# Patient Record
Sex: Male | Born: 1960 | Race: White | Hispanic: No | Marital: Married | Smoking: Never smoker
Health system: Southern US, Community
[De-identification: ages and names within clinical notes are randomized; demographics above are authoritative.]

## PROBLEM LIST (undated history)

## (undated) DIAGNOSIS — I872 Venous insufficiency (chronic) (peripheral): Secondary | ICD-10-CM

## (undated) DIAGNOSIS — I10 Essential (primary) hypertension: Secondary | ICD-10-CM

## (undated) DIAGNOSIS — E785 Hyperlipidemia, unspecified: Secondary | ICD-10-CM

## (undated) HISTORY — DX: Venous insufficiency (chronic) (peripheral): I87.2

## (undated) HISTORY — DX: Essential (primary) hypertension: I10

## (undated) HISTORY — DX: Hyperlipidemia, unspecified: E78.5

## (undated) HISTORY — PX: NO PAST SURGERIES: SHX2092

---

## 2011-05-30 ENCOUNTER — Ambulatory Visit (INDEPENDENT_AMBULATORY_CARE_PROVIDER_SITE_OTHER): Payer: BC Managed Care – PPO | Admitting: Internal Medicine

## 2011-05-30 ENCOUNTER — Encounter: Payer: Self-pay | Admitting: Internal Medicine

## 2011-05-30 DIAGNOSIS — E669 Obesity, unspecified: Secondary | ICD-10-CM

## 2011-05-30 DIAGNOSIS — E785 Hyperlipidemia, unspecified: Secondary | ICD-10-CM | POA: Insufficient documentation

## 2011-05-30 DIAGNOSIS — I10 Essential (primary) hypertension: Secondary | ICD-10-CM

## 2011-05-30 DIAGNOSIS — Z23 Encounter for immunization: Secondary | ICD-10-CM

## 2011-05-30 LAB — COMPREHENSIVE METABOLIC PANEL
ALT: 41 U/L (ref 0–53)
AST: 25 U/L (ref 0–37)
Albumin: 4.1 g/dL (ref 3.5–5.2)
Alkaline Phosphatase: 74 U/L (ref 39–117)
BUN: 15 mg/dL (ref 6–23)
CO2: 31 mEq/L (ref 19–32)
Calcium: 9.2 mg/dL (ref 8.4–10.5)
Chloride: 104 mEq/L (ref 96–112)
Creatinine, Ser: 1 mg/dL (ref 0.4–1.5)
GFR: 88.37 mL/min (ref >60.00)
Glucose, Bld: 80 mg/dL (ref 70–99)
Potassium: 3.9 mEq/L (ref 3.5–5.1)
Sodium: 139 mEq/L (ref 135–145)
Total Bilirubin: 0.5 mg/dL (ref 0.3–1.2)
Total Protein: 6.9 g/dL (ref 6.0–8.3)

## 2011-05-30 LAB — LIPID PANEL
Cholesterol: 162 mg/dL (ref 0–200)
HDL: 44.1 mg/dL (ref >39.00)
LDL Cholesterol: 87 mg/dL (ref 0–99)
Total CHOL/HDL Ratio: 4
Triglycerides: 155 mg/dL — ABNORMAL HIGH (ref 0.0–149.0)
VLDL: 31 mg/dL (ref 0.0–40.0)

## 2011-05-30 MED ORDER — ATORVASTATIN CALCIUM 20 MG PO TABS: 20.0000 mg | ORAL_TABLET | Freq: Every day | ORAL | Status: DC

## 2011-05-30 MED ORDER — HYDROCHLOROTHIAZIDE 25 MG PO TABS: 25.0000 mg | ORAL_TABLET | Freq: Every day | ORAL | Status: DC

## 2011-05-30 MED ORDER — AMLODIPINE BESY-BENAZEPRIL HCL 5-20 MG PO CAPS: 1.0000 | ORAL_CAPSULE | Freq: Every day | ORAL | Status: DC

## 2011-05-30 NOTE — Assessment & Plan Note (Signed)
BMI 35. Discussed weight loss techniques including caloric restriction and increasing physical activity. Patient is interested in trying raspberry key tones as the potential supplement. He will bring by any supplements that he starts on so we can incorporate these into his record.

## 2011-05-30 NOTE — Progress Notes (Signed)
Subjective:    Patient ID: Joseph Dalton, male    DOB: [birth year], 51 y.o.   MRN: [id number]  HPI  51 year old male with history of hyperlipidemia and hypertension presents to establish care. He denies any complaints today. He reports he is generally feeling well. 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 He reports full compliance with his medication. He has been trying to lose weight but has limited time for exercise because of work constraints. He is interested in trying raspberry key tones as a supplement to help him lose weight. He had a friend who had some success with this. He does not keep a food diary. He does not count calories.   In regards to his hyperlipidemia, he reports compliance with Lipitor. He denies any myalgia. In regards to her hypertension, he reports compliance with amlodipine benazepril and hydrochlorothiazide. He denies any chest pain, palpitations, or headache.  Outpatient Encounter Prescriptions as of 05/30/2011  Medication Sig Dispense Refill  . amLODipine-benazepril (LOTREL) 5-20 MG per capsule Take 1 capsule by mouth daily.  90 capsule  4  . atorvastatin (LIPITOR) 20 MG tablet Take 1 tablet (20 mg total) by mouth daily.  90 tablet  4  . hydrochlorothiazide (HYDRODIURIL) 25 MG tablet Take 1 tablet (25 mg total) by mouth daily.  90 tablet  4  . Multiple Vitamins-Minerals (MULTIVITAMIN WITH MINERALS) tablet Take 1 tablet by mouth daily.        Review of Systems  Constitutional: Negative for fever, chills, activity change, appetite change, fatigue and unexpected weight change.  Eyes: Negative for visual disturbance.  Respiratory: Negative for cough and shortness of breath.   Cardiovascular: Negative for chest pain, palpitations and leg swelling.  Gastrointestinal: Negative for abdominal pain and abdominal distention.  Genitourinary: Negative for dysuria, urgency and difficulty urinating.  Musculoskeletal: Negative for arthralgias and gait problem.  Skin:  Negative for color change and rash.  Hematological: Negative for adenopathy.  Psychiatric/Behavioral: Negative for sleep disturbance and dysphoric mood. The patient is not nervous/anxious.    BP 142/86  Pulse 86  Temp(Src) 98.5 F (36.9 C) (Oral)  Ht 6\' 2"  (1.88 m)  Wt 274 lb (124.286 kg)  BMI 35.18 kg/m2  SpO2 98%     Objective:   Physical Exam  Constitutional: He is oriented to person, place, and time. He appears well-developed and well-nourished. No distress.  HENT:  Head: Normocephalic and atraumatic.  Right Ear: External ear normal.  Left Ear: External ear normal.  Nose: Nose normal.  Mouth/Throat: Oropharynx is clear and moist. No oropharyngeal exudate.  Eyes: Conjunctivae and EOM are normal. Pupils are equal, round, and reactive to light. Right eye exhibits no discharge. Left eye exhibits no discharge. No scleral icterus.  Neck: Normal range of motion. Neck supple. No tracheal deviation present. No thyromegaly present.  Cardiovascular: Normal rate, regular rhythm and normal heart sounds.  Exam reveals no gallop and no friction rub.   No murmur heard. Pulmonary/Chest: Effort normal and breath sounds normal. No respiratory distress. He has no wheezes. He has no rales. He exhibits no tenderness.  Musculoskeletal: Normal range of motion. He exhibits no edema.  Lymphadenopathy:    He has no cervical adenopathy.  Neurological: He is alert and oriented to person, place, and time. No cranial nerve deficit. Coordination normal.  Skin: Skin is warm and dry. No rash noted. He is not diaphoretic. No erythema. No pallor.  Psychiatric: He has a normal mood and affect. His behavior is normal.  Judgment and thought content normal.          Assessment & Plan:

## 2011-05-30 NOTE — Assessment & Plan Note (Signed)
Will check lipids and LFTs with labs today. Continue Lipitor. Goal LDL less than 100. Followup in 6 months.

## 2011-05-30 NOTE — Assessment & Plan Note (Signed)
Blood pressure slightly above goal today. We'll plan to continue amlodipine benazepril and hydrochlorothiazide. We'll check renal function with labs. We discussed limiting sodium intake. Patient will followup in 6 months.

## 2011-06-12 ENCOUNTER — Encounter: Payer: Self-pay | Admitting: Internal Medicine

## 2011-06-12 ENCOUNTER — Ambulatory Visit (INDEPENDENT_AMBULATORY_CARE_PROVIDER_SITE_OTHER): Payer: BC Managed Care – PPO | Admitting: Internal Medicine

## 2011-06-12 VITALS — BP 132/80 | HR 110 | Temp 98.6°F | Ht 74.0 in | Wt 273.0 lb

## 2011-06-12 DIAGNOSIS — J40 Bronchitis, not specified as acute or chronic: Secondary | ICD-10-CM

## 2011-06-12 MED ORDER — AZITHROMYCIN 250 MG PO TABS: ORAL_TABLET | ORAL | Status: AC

## 2011-06-12 MED ORDER — BENZONATATE 200 MG PO CAPS: 200.0000 mg | ORAL_CAPSULE | Freq: Two times a day (BID) | ORAL | Status: AC | PRN

## 2011-06-12 MED ORDER — GUAIFENESIN-CODEINE 100-10 MG/5ML PO SYRP: 5.0000 mL | ORAL_SOLUTION | Freq: Three times a day (TID) | ORAL | Status: AC | PRN

## 2011-06-12 NOTE — Assessment & Plan Note (Signed)
Symptoms consistent with early bronchitis. Given paroxysms of cough, question pertussis. Will treat with azithromycin, and will use tessalon and codeine prn cough.  Pt will call if symptoms not improving by Friday.

## 2011-06-12 NOTE — Patient Instructions (Signed)
Use tylenol or ibuprofen as needed for pain or fever.  Use zyrtec or claritin as needed during the day to help dry nasal secretions.  Start antibiotic and cough medication.  Call Friday if symptoms aren't improving.

## 2011-06-12 NOTE — Progress Notes (Signed)
Subjective:    Patient ID: Joseph Dalton, male    DOB: 1960/05/24, 51 y.o.   MRN: [id number]  HPI 51YO male with h/o HTN presents with 5 day history of cough productive of purulent sputum, nasal drainage, headache, and malaise.  Denies fever or chills.  Denies chest pain or dyspnea.  Has been taking OTC cough medication and tried wife's prescription cough medication with no improvement.   Outpatient Encounter Prescriptions as of 06/12/2011  Medication Sig Dispense Refill  . amLODipine-benazepril (LOTREL) 5-20 MG per capsule Take 1 capsule by mouth daily.  90 capsule  4  . atorvastatin (LIPITOR) 20 MG tablet Take 1 tablet (20 mg total) by mouth daily.  90 tablet  4  . hydrochlorothiazide (HYDRODIURIL) 25 MG tablet Take 1 tablet (25 mg total) by mouth daily.  90 tablet  4  . Multiple Vitamins-Minerals (MULTIVITAMIN WITH MINERALS) tablet Take 1 tablet by mouth daily.      Marland Kitchen azithromycin (ZITHROMAX Z-PAK) 250 MG tablet Take 2 pills day 1, then 1 pill daily days 2-5  6 each  0  . benzonatate (TESSALON) 200 MG capsule Take 1 capsule (200 mg total) by mouth 2 (two) times daily as needed for cough.  30 capsule  0  . guaiFENesin-codeine (ROBITUSSIN AC) 100-10 MG/5ML syrup Take 5 mLs by mouth 3 (three) times daily as needed for cough.  120 mL  0    Review of Systems  Constitutional: Negative for fever, chills, activity change and fatigue.  HENT: Positive for congestion, rhinorrhea and postnasal drip. Negative for hearing loss, ear pain, nosebleeds, sore throat, sneezing, trouble swallowing, neck pain, neck stiffness, voice change, sinus pressure, tinnitus and ear discharge.   Eyes: Negative for discharge, redness, itching and visual disturbance.  Respiratory: Positive for cough. Negative for chest tightness, shortness of breath, wheezing and stridor.   Cardiovascular: Negative for chest pain and leg swelling.  Musculoskeletal: Negative for myalgias and arthralgias.  Skin: Negative for color change and  rash.  Neurological: Negative for dizziness, facial asymmetry and headaches.  Psychiatric/Behavioral: Negative for sleep disturbance.   BP 132/80  Pulse 110  Temp(Src) 98.6 F (37 C) (Oral)  Ht 6\' 2"  (1.88 m)  Wt 273 lb (123.832 kg)  BMI 35.05 kg/m2  SpO2 97%     Objective:   Physical Exam  Constitutional: He is oriented to person, place, and time. He appears well-developed and well-nourished. No distress.  HENT:  Head: Normocephalic and atraumatic.  Right Ear: External ear normal.  Left Ear: External ear normal.  Nose: Nose normal.  Mouth/Throat: Oropharynx is clear and moist. No oropharyngeal exudate.  Eyes: Conjunctivae and EOM are normal. Pupils are equal, round, and reactive to light. Right eye exhibits no discharge. Left eye exhibits no discharge. No scleral icterus.  Neck: Normal range of motion. Neck supple. No tracheal deviation present. No thyromegaly present.  Cardiovascular: Normal rate, regular rhythm and normal heart sounds.  Exam reveals no gallop and no friction rub.   No murmur heard. Pulmonary/Chest: Effort normal and breath sounds normal. No respiratory distress. He has no wheezes. He has no rales. He exhibits no tenderness.  Musculoskeletal: Normal range of motion. He exhibits no edema.  Lymphadenopathy:    He has no cervical adenopathy.  Neurological: He is alert and oriented to person, place, and time. No cranial nerve deficit. Coordination normal.  Skin: Skin is warm and dry. No rash noted. He is not diaphoretic. No erythema. No pallor.  Psychiatric: He has a normal mood  and affect. His behavior is normal. Judgment and thought content normal.          Assessment & Plan:

## 2011-07-01 ENCOUNTER — Encounter: Payer: Self-pay | Admitting: Internal Medicine

## 2011-07-01 ENCOUNTER — Telehealth: Payer: Self-pay | Admitting: *Deleted

## 2011-07-01 ENCOUNTER — Ambulatory Visit (INDEPENDENT_AMBULATORY_CARE_PROVIDER_SITE_OTHER): Payer: BC Managed Care – PPO | Admitting: Internal Medicine

## 2011-07-01 VITALS — BP 128/70 | HR 97 | Temp 98.3°F | Ht 74.0 in | Wt 267.0 lb

## 2011-07-01 DIAGNOSIS — J40 Bronchitis, not specified as acute or chronic: Secondary | ICD-10-CM

## 2011-07-01 MED ORDER — AMOXICILLIN-POT CLAVULANATE 875-125 MG PO TABS: 1.0000 | ORAL_TABLET | Freq: Two times a day (BID) | ORAL | Status: AC

## 2011-07-01 MED ORDER — HYDROCOD POLST-CHLORPHEN POLST 10-8 MG/5ML PO LQCR: 5.0000 mL | Freq: Every evening | ORAL | Status: DC | PRN

## 2011-07-01 NOTE — Progress Notes (Signed)
Subjective:    Patient ID: Joseph Dalton, male    DOB: 1961/01/03, 51 y.o.   MRN: [id number]  HPI 51 year old male with a recent episode of bronchitis presents for acute visit complaining of two-day history of malaise, nasal congestion, and cough productive of purulent sputum. He reports that after he was treated with azithromycin at his last visit, his symptoms improved and had nearly resolved. He does note some occasional cough that was persistent. This is described as mostly nonproductive with just occasional production of small amount of yellow sputum. However, 2 days ago he developed recurrent chills, subjective fever, malaise, clear nasal congestion. He notes that his wife is sick with similar symptoms. He has been using over-the-counter cough and cold medications with no improvement. He denies shortness of breath or chest pain. He continues to have occasional cough productive of yellow to green sputum.  Outpatient Encounter Prescriptions as of 07/01/2011  Medication Sig Dispense Refill  . amLODipine-benazepril (LOTREL) 5-20 MG per capsule Take 1 capsule by mouth daily.  90 capsule  4  . atorvastatin (LIPITOR) 20 MG tablet Take 1 tablet (20 mg total) by mouth daily.  90 tablet  4  . hydrochlorothiazide (HYDRODIURIL) 25 MG tablet Take 1 tablet (25 mg total) by mouth daily.  90 tablet  4  . Multiple Vitamins-Minerals (MULTIVITAMIN WITH MINERALS) tablet Take 1 tablet by mouth daily.      Marland Kitchen amoxicillin-clavulanate (AUGMENTIN) 875-125 MG per tablet Take 1 tablet by mouth 2 (two) times daily.  20 tablet  0  . chlorpheniramine-HYDROcodone (TUSSIONEX) 10-8 MG/5ML LQCR Take 5 mLs by mouth at bedtime as needed.  140 mL  0    Review of Systems  Constitutional: Positive for fever and chills. Negative for activity change and fatigue.  HENT: Positive for congestion, rhinorrhea, postnasal drip and sinus pressure. Negative for hearing loss, ear pain, nosebleeds, sore throat, sneezing, trouble swallowing, neck  pain, neck stiffness, voice change, tinnitus and ear discharge.   Eyes: Positive for discharge. Negative for redness, itching and visual disturbance.  Respiratory: Positive for cough. Negative for chest tightness, shortness of breath, wheezing and stridor.   Cardiovascular: Negative for chest pain and leg swelling.  Musculoskeletal: Negative for myalgias and arthralgias.  Skin: Negative for color change and rash.  Neurological: Negative for dizziness, facial asymmetry and headaches.  Psychiatric/Behavioral: Negative for sleep disturbance.   BP 128/70  Pulse 97  Temp(Src) 98.3 F (36.8 C) (Oral)  Ht 6\' 2"  (1.88 m)  Wt 267 lb (121.11 kg)  BMI 34.28 kg/m2  SpO2 99%     Objective:   Physical Exam  Constitutional: He is oriented to person, place, and time. He appears well-developed and well-nourished. No distress.  HENT:  Head: Normocephalic and atraumatic.  Right Ear: External ear normal.  Left Ear: External ear normal.  Nose: Nose normal.  Mouth/Throat: Oropharynx is clear and moist. No oropharyngeal exudate.  Eyes: Conjunctivae and EOM are normal. Pupils are equal, round, and reactive to light. Right eye exhibits no discharge. Left eye exhibits no discharge. No scleral icterus.  Neck: Normal range of motion. Neck supple. No tracheal deviation present. No thyromegaly present.  Cardiovascular: Normal rate, regular rhythm and normal heart sounds.  Exam reveals no gallop and no friction rub.   No murmur heard. Pulmonary/Chest: Effort normal. No accessory muscle usage. Not tachypneic. No respiratory distress. He has no decreased breath sounds. He has no wheezes. He has rhonchi in the right upper field and the right middle field. He has  no rales. He exhibits no tenderness.  Musculoskeletal: Normal range of motion. He exhibits no edema.  Lymphadenopathy:    He has no cervical adenopathy.  Neurological: He is alert and oriented to person, place, and time. No cranial nerve deficit.  Coordination normal.  Skin: Skin is warm and dry. No rash noted. He is not diaphoretic. No erythema. No pallor.  Psychiatric: He has a normal mood and affect. His behavior is normal. Judgment and thought content normal.          Assessment & Plan:

## 2011-07-01 NOTE — Telephone Encounter (Signed)
I spoke w/pt - He c/o reoccurrence of sinus congestion, chills, productive cough and drainage. Advised OV for re-eval due to productive cough w/green mucus and chills. Pt agreed and is scheduled for OV this AM

## 2011-07-01 NOTE — Assessment & Plan Note (Signed)
Patient with recurrent bronchitis likely secondary to repeated viral infection. We discussed that most of his symptoms will take time to resolve as he recovers from viral infection. Encouraged him to continue to use over-the-counter dextromethorphan for cough and diphenhydramine. Given rhonchi noted on pulmonary exam, will start antibiotics with Augmentin. He will call if symptoms are not improving over the next 72 hours.

## 2011-09-24 ENCOUNTER — Telehealth: Payer: Self-pay | Admitting: Internal Medicine

## 2011-09-24 NOTE — Telephone Encounter (Signed)
Scheduled patient to see Dr. Dan Humphreys tomorrow morning at 10:00, patient advised.  Advised patient that if pain worsened or symptoms changed before tomorrow then he needs to go to the ER or Urgent Care to be seen today or overnight.

## 2011-09-24 NOTE — Telephone Encounter (Signed)
Can we put him in at 10am tomorrow?

## 2011-09-24 NOTE — Telephone Encounter (Signed)
Emergent call Caller: Joseph Dalton/Patient; PCP: Ronna Polio; CB#: [phone]; Call regarding Nagging Constant Pain in Testicles; onset 09/23/11, afebrile, denies injury, states similar pain in past, where he was treated, not available today. Symptoms reviewed Scrotum Symptoms, with pain, appt advised, no appt on [facility] schedule for 09/24/11, please call pt re: appt.

## 2011-09-25 ENCOUNTER — Telehealth: Payer: Self-pay | Admitting: Internal Medicine

## 2011-09-25 ENCOUNTER — Ambulatory Visit: Payer: BC Managed Care – PPO | Admitting: Internal Medicine

## 2011-09-25 NOTE — Telephone Encounter (Signed)
Pt was having testicular pain, and canceled appointment for acute visit today. Are symptoms better?

## 2011-09-26 NOTE — Telephone Encounter (Signed)
Called and left message on cell phone voicemail for patient to return call.

## 2011-09-27 NOTE — Telephone Encounter (Signed)
Spoke with patient via telephone and he stated that he went to an Urgent Care for treatment.

## 2011-10-14 ENCOUNTER — Ambulatory Visit (INDEPENDENT_AMBULATORY_CARE_PROVIDER_SITE_OTHER): Payer: BC Managed Care – PPO | Admitting: Internal Medicine

## 2011-10-14 ENCOUNTER — Encounter: Payer: Self-pay | Admitting: Internal Medicine

## 2011-10-14 VITALS — BP 128/88 | HR 72 | Temp 98.2°F | Ht 74.0 in | Wt 272.8 lb

## 2011-10-14 DIAGNOSIS — Z0000 Encounter for general adult medical examination without abnormal findings: Secondary | ICD-10-CM

## 2011-10-14 DIAGNOSIS — I10 Essential (primary) hypertension: Secondary | ICD-10-CM

## 2011-10-14 LAB — COMPREHENSIVE METABOLIC PANEL
ALT: 38 U/L (ref 0–53)
AST: 25 U/L (ref 0–37)
Albumin: 3.9 g/dL (ref 3.5–5.2)
Alkaline Phosphatase: 78 U/L (ref 39–117)
BUN: 19 mg/dL (ref 6–23)
CO2: 29 mEq/L (ref 19–32)
Calcium: 8.9 mg/dL (ref 8.4–10.5)
Chloride: 102 mEq/L (ref 96–112)
Creatinine, Ser: 0.9 mg/dL (ref 0.4–1.5)
GFR: 95.06 mL/min (ref >60.00)
Glucose, Bld: 102 mg/dL — ABNORMAL HIGH (ref 70–99)
Potassium: 3.6 mEq/L (ref 3.5–5.1)
Sodium: 140 mEq/L (ref 135–145)
Total Bilirubin: 1 mg/dL (ref 0.3–1.2)
Total Protein: 6.9 g/dL (ref 6.0–8.3)

## 2011-10-14 LAB — POCT URINALYSIS DIPSTICK
Bilirubin, UA: NEGATIVE
Blood, UA: NEGATIVE
Glucose, UA: NEGATIVE
Ketones, UA: NEGATIVE
Leukocytes, UA: NEGATIVE
Nitrite, UA: NEGATIVE
Protein, UA: NEGATIVE
Spec Grav, UA: 1.02
Urobilinogen, UA: 0.2
pH, UA: 7

## 2011-10-14 LAB — MICROALBUMIN / CREATININE URINE RATIO
Creatinine,U: 79.3 mg/dL
Microalb Creat Ratio: 0.4 mg/g (ref 0.0–30.0)
Microalb, Ur: 0.3 mg/dL (ref 0.0–1.9)

## 2011-10-14 NOTE — Progress Notes (Signed)
  Subjective:    Patient ID: Joseph Dalton, male    DOB: 08-Aug-1960, 51 y.o.   MRN: [id number]  HPI 51 year old male with history of hypertension and hyperlipidemia presents for DOT physical. He reports that he is generally feeling well. He denies any concerns today. He reports full compliance with his medications.  Outpatient Encounter Prescriptions as of 10/14/2011  Medication Sig Dispense Refill  . amLODipine-benazepril (LOTREL) 5-20 MG per capsule Take 1 capsule by mouth daily.  90 capsule  4  . atorvastatin (LIPITOR) 20 MG tablet Take 1 tablet (20 mg total) by mouth daily.  90 tablet  4  . hydrochlorothiazide (HYDRODIURIL) 25 MG tablet Take 1 tablet (25 mg total) by mouth daily.  90 tablet  4  . Multiple Vitamins-Minerals (MULTIVITAMIN WITH MINERALS) tablet Take 1 tablet by mouth daily.       BP 128/88  Pulse 72  Temp 98.2 F (36.8 C) (Oral)  Ht 6\' 2"  (1.88 m)  Wt 272 lb 12 oz (123.719 kg)  BMI 35.02 kg/m2  SpO2 96%  Review of Systems  Constitutional: Negative for fever, chills, activity change, appetite change, fatigue and unexpected weight change.  Eyes: Negative for visual disturbance.  Respiratory: Negative for cough and shortness of breath.   Cardiovascular: Negative for chest pain, palpitations and leg swelling.  Gastrointestinal: Negative for abdominal pain and abdominal distention.  Genitourinary: Negative for dysuria, urgency and difficulty urinating.  Musculoskeletal: Negative for arthralgias and gait problem.  Skin: Negative for color change and rash.  Hematological: Negative for adenopathy.  Psychiatric/Behavioral: Negative for disturbed wake/sleep cycle and dysphoric mood. The patient is not nervous/anxious.        Objective:   Physical Exam  Constitutional: He is oriented to person, place, and time. He appears well-developed and well-nourished. No distress.  HENT:  Head: Normocephalic and atraumatic.  Right Ear: External ear normal.  Left Ear: External ear  normal.  Nose: Nose normal.  Mouth/Throat: Oropharynx is clear and moist. No oropharyngeal exudate.  Eyes: Conjunctivae and EOM are normal. Pupils are equal, round, and reactive to light. Right eye exhibits no discharge. Left eye exhibits no discharge. No scleral icterus.  Neck: Normal range of motion. Neck supple. No tracheal deviation present. No thyromegaly present.  Cardiovascular: Normal rate, regular rhythm and normal heart sounds.  Exam reveals no gallop and no friction rub.   No murmur heard. Pulmonary/Chest: Effort normal and breath sounds normal. No respiratory distress. He has no wheezes. He has no rales. He exhibits no tenderness.  Musculoskeletal: Normal range of motion. He exhibits no edema.  Lymphadenopathy:    He has no cervical adenopathy.  Neurological: He is alert and oriented to person, place, and time. No cranial nerve deficit. Coordination normal.  Skin: Skin is warm and dry. No rash noted. He is not diaphoretic. No erythema. No pallor.  Psychiatric: He has a normal mood and affect. His behavior is normal. Judgment and thought content normal.          Assessment & Plan:

## 2011-10-14 NOTE — Assessment & Plan Note (Signed)
General exam is normal today. Patient is up-to-date on health maintenance. Blood pressure is well-controlled on current medications. We'll plan to check CMP with labs today. We'll also check urine microalbumin. Vision and hearing check was normal today. He will followup in 6 months or sooner as needed.

## 2011-10-22 ENCOUNTER — Encounter: Payer: Self-pay | Admitting: Internal Medicine

## 2012-04-15 ENCOUNTER — Encounter: Payer: Self-pay | Admitting: *Deleted

## 2012-04-15 ENCOUNTER — Encounter: Payer: Self-pay | Admitting: Internal Medicine

## 2012-04-15 ENCOUNTER — Ambulatory Visit (INDEPENDENT_AMBULATORY_CARE_PROVIDER_SITE_OTHER): Payer: BC Managed Care – PPO | Admitting: Internal Medicine

## 2012-04-15 VITALS — BP 132/88 | HR 68 | Temp 98.6°F | Ht 74.0 in | Wt 279.0 lb

## 2012-04-15 DIAGNOSIS — Z1211 Encounter for screening for malignant neoplasm of colon: Secondary | ICD-10-CM

## 2012-04-15 DIAGNOSIS — I10 Essential (primary) hypertension: Secondary | ICD-10-CM

## 2012-04-15 DIAGNOSIS — Z125 Encounter for screening for malignant neoplasm of prostate: Secondary | ICD-10-CM

## 2012-04-15 DIAGNOSIS — E669 Obesity, unspecified: Secondary | ICD-10-CM

## 2012-04-15 DIAGNOSIS — E785 Hyperlipidemia, unspecified: Secondary | ICD-10-CM

## 2012-04-15 LAB — COMPREHENSIVE METABOLIC PANEL
ALT: 65 U/L — ABNORMAL HIGH (ref 0–53)
AST: 37 U/L (ref 0–37)
Albumin: 3.8 g/dL (ref 3.5–5.2)
Alkaline Phosphatase: 82 U/L (ref 39–117)
BUN: 18 mg/dL (ref 6–23)
CO2: 30 mEq/L (ref 19–32)
Calcium: 9 mg/dL (ref 8.4–10.5)
Chloride: 106 mEq/L (ref 96–112)
Creatinine, Ser: 0.8 mg/dL (ref 0.4–1.5)
GFR: 104.16 mL/min (ref >60.00)
Glucose, Bld: 111 mg/dL — ABNORMAL HIGH (ref 70–99)
Potassium: 3.9 mEq/L (ref 3.5–5.1)
Sodium: 140 mEq/L (ref 135–145)
Total Bilirubin: 0.8 mg/dL (ref 0.3–1.2)
Total Protein: 6.8 g/dL (ref 6.0–8.3)

## 2012-04-15 LAB — LIPID PANEL
Cholesterol: 158 mg/dL (ref 0–200)
HDL: 38.4 mg/dL — ABNORMAL LOW (ref >39.00)
LDL Cholesterol: 99 mg/dL (ref 0–99)
Total CHOL/HDL Ratio: 4
Triglycerides: 105 mg/dL (ref 0.0–149.0)
VLDL: 21 mg/dL (ref 0.0–40.0)

## 2012-04-15 LAB — MICROALBUMIN / CREATININE URINE RATIO
Creatinine,U: 124.5 mg/dL
Microalb Creat Ratio: 0.3 mg/g (ref 0.0–30.0)
Microalb, Ur: 0.4 mg/dL (ref 0.0–1.9)

## 2012-04-15 MED ORDER — HYDROCHLOROTHIAZIDE 25 MG PO TABS: 25.0000 mg | ORAL_TABLET | Freq: Every day | ORAL | Status: DC

## 2012-04-15 MED ORDER — ATORVASTATIN CALCIUM 20 MG PO TABS: 20.0000 mg | ORAL_TABLET | Freq: Every day | ORAL | Status: DC

## 2012-04-15 MED ORDER — AMLODIPINE BESY-BENAZEPRIL HCL 5-20 MG PO CAPS: 1.0000 | ORAL_CAPSULE | Freq: Every day | ORAL | Status: DC

## 2012-04-15 NOTE — Assessment & Plan Note (Signed)
Will check lipids and LFTs with labs today. Continue atorvastatin. Follow up 6 months and prn.

## 2012-04-15 NOTE — Assessment & Plan Note (Signed)
Wt Readings from Last 3 Encounters:  04/15/12 279 lb (126.554 kg)  10/14/11 272 lb 12 oz (123.719 kg)  07/01/11 267 lb (121.11 kg)     Weight increased 7lb compared to previous visit.  Encouraged better compliance with healthy diet. Encouraged regular physical activity.

## 2012-04-15 NOTE — Assessment & Plan Note (Signed)
Discussed potential benefits and risks of PSA testing. Will send PSA with labs today.

## 2012-04-15 NOTE — Assessment & Plan Note (Signed)
  BP Readings from Last 3 Encounters:  04/15/12 132/88  10/14/11 128/88  07/01/11 128/70   BP has been fairly well controlled with current medications. Will plan to continue. Will check renal function and urine microalbumin with labs today.

## 2012-04-15 NOTE — Assessment & Plan Note (Signed)
Will set up screening colonoscopy. 

## 2012-04-15 NOTE — Progress Notes (Signed)
  Subjective:    Patient ID: Joseph Dalton, male    DOB: 01/24/1961, 52 y.o.   MRN: [id number]  HPI  52 year old male with history of hypertension, hyperlipidemia, obesity presents for followup. He reports he is generally feeling well. He reports compliance with his medication. He denies any recent headache, chest pain, palpitations. He does not regularly check his blood pressure. He notes some dietary indiscretion over the holidays. He declines flu shot today.  Outpatient Encounter Prescriptions as of 04/15/2012  Medication Sig Dispense Refill  . amLODipine-benazepril (LOTREL) 5-20 MG per capsule Take 1 capsule by mouth daily.  90 capsule  4  . atorvastatin (LIPITOR) 20 MG tablet Take 1 tablet (20 mg total) by mouth daily.  90 tablet  4  . hydrochlorothiazide (HYDRODIURIL) 25 MG tablet Take 1 tablet (25 mg total) by mouth daily.  90 tablet  4  . Multiple Vitamins-Minerals (MULTIVITAMIN WITH MINERALS) tablet Take 1 tablet by mouth daily.       BP 132/88  Pulse 68  Temp 98.6 F (37 C) (Oral)  Ht 6\' 2"  (1.88 m)  Wt 279 lb (126.554 kg)  BMI 35.82 kg/m2  SpO2 95%  Review of Systems  Constitutional: Negative for fever, chills, activity change, appetite change, fatigue and unexpected weight change.  Eyes: Negative for visual disturbance.  Respiratory: Negative for cough and shortness of breath.   Cardiovascular: Negative for chest pain, palpitations and leg swelling.  Gastrointestinal: Negative for abdominal pain and abdominal distention.  Genitourinary: Negative for dysuria, urgency and difficulty urinating.  Musculoskeletal: Negative for arthralgias and gait problem.  Skin: Negative for color change and rash.  Hematological: Negative for adenopathy.  Psychiatric/Behavioral: Negative for sleep disturbance and dysphoric mood. The patient is not nervous/anxious.        Objective:   Physical Exam  Constitutional: He is oriented to person, place, and time. He appears well-developed and  well-nourished. No distress.  HENT:  Head: Normocephalic and atraumatic.  Right Ear: External ear normal.  Left Ear: External ear normal.  Nose: Nose normal.  Mouth/Throat: Oropharynx is clear and moist. No oropharyngeal exudate.  Eyes: Conjunctivae normal and EOM are normal. Pupils are equal, round, and reactive to light. Right eye exhibits no discharge. Left eye exhibits no discharge. No scleral icterus.  Neck: Normal range of motion. Neck supple. No tracheal deviation present. No thyromegaly present.  Cardiovascular: Normal rate, regular rhythm and normal heart sounds.  Exam reveals no gallop and no friction rub.   No murmur heard. Pulmonary/Chest: Effort normal and breath sounds normal. No respiratory distress. He has no wheezes. He has no rales. He exhibits no tenderness.  Musculoskeletal: Normal range of motion. He exhibits no edema.  Lymphadenopathy:    He has no cervical adenopathy.  Neurological: He is alert and oriented to person, place, and time. No cranial nerve deficit. Coordination normal.  Skin: Skin is warm and dry. No rash noted. He is not diaphoretic. No erythema. No pallor.  Psychiatric: He has a normal mood and affect. His behavior is normal. Judgment and thought content normal.          Assessment & Plan:

## 2012-04-16 LAB — PSA, TOTAL AND FREE
PSA, Free Pct: 32 % (ref >25)
PSA, Free: 0.23 ng/mL
PSA: 0.71 ng/mL (ref <=4.00)

## 2012-05-23 ENCOUNTER — Other Ambulatory Visit: Payer: Self-pay

## 2012-05-26 ENCOUNTER — Other Ambulatory Visit (INDEPENDENT_AMBULATORY_CARE_PROVIDER_SITE_OTHER): Payer: BC Managed Care – PPO

## 2012-05-26 ENCOUNTER — Telehealth: Payer: Self-pay | Admitting: *Deleted

## 2012-05-26 DIAGNOSIS — R7402 Elevation of levels of lactic acid dehydrogenase (LDH): Secondary | ICD-10-CM

## 2012-05-26 DIAGNOSIS — R7401 Elevation of levels of liver transaminase levels: Secondary | ICD-10-CM

## 2012-05-26 LAB — COMPREHENSIVE METABOLIC PANEL
ALT: 40 U/L (ref 0–53)
AST: 26 U/L (ref 0–37)
Albumin: 3.9 g/dL (ref 3.5–5.2)
Alkaline Phosphatase: 81 U/L (ref 39–117)
BUN: 17 mg/dL (ref 6–23)
CO2: 28 mEq/L (ref 19–32)
Calcium: 8.9 mg/dL (ref 8.4–10.5)
Chloride: 107 mEq/L (ref 96–112)
Creatinine, Ser: 0.9 mg/dL (ref 0.4–1.5)
GFR: 94.82 mL/min (ref >60.00)
Glucose, Bld: 99 mg/dL (ref 70–99)
Potassium: 3.8 mEq/L (ref 3.5–5.1)
Sodium: 142 mEq/L (ref 135–145)
Total Bilirubin: 0.6 mg/dL (ref 0.3–1.2)
Total Protein: 6.8 g/dL (ref 6.0–8.3)

## 2012-05-26 NOTE — Telephone Encounter (Signed)
CMP Dx 790.4

## 2012-05-26 NOTE — Telephone Encounter (Signed)
What labs and dx would you like for this pt? Thank you  

## 2012-08-14 LAB — HM COLONOSCOPY: HM Colonoscopy: NORMAL

## 2012-08-17 ENCOUNTER — Ambulatory Visit: Payer: Self-pay | Admitting: Unknown Physician Specialty

## 2012-09-09 ENCOUNTER — Encounter: Payer: Self-pay | Admitting: Internal Medicine

## 2012-10-14 ENCOUNTER — Ambulatory Visit (INDEPENDENT_AMBULATORY_CARE_PROVIDER_SITE_OTHER): Payer: BC Managed Care – PPO | Admitting: Internal Medicine

## 2012-10-14 ENCOUNTER — Encounter: Payer: Self-pay | Admitting: Internal Medicine

## 2012-10-14 VITALS — BP 122/82 | HR 64 | Temp 98.2°F | Ht 72.25 in | Wt 267.0 lb

## 2012-10-14 DIAGNOSIS — E785 Hyperlipidemia, unspecified: Secondary | ICD-10-CM

## 2012-10-14 DIAGNOSIS — I10 Essential (primary) hypertension: Secondary | ICD-10-CM

## 2012-10-14 DIAGNOSIS — E669 Obesity, unspecified: Secondary | ICD-10-CM

## 2012-10-14 DIAGNOSIS — Z0000 Encounter for general adult medical examination without abnormal findings: Secondary | ICD-10-CM

## 2012-10-14 LAB — COMPREHENSIVE METABOLIC PANEL
ALT: 47 U/L (ref 0–53)
AST: 32 U/L (ref 0–37)
Albumin: 4.1 g/dL (ref 3.5–5.2)
Alkaline Phosphatase: 84 U/L (ref 39–117)
BUN: 15 mg/dL (ref 6–23)
CO2: 26 mEq/L (ref 19–32)
Calcium: 9.2 mg/dL (ref 8.4–10.5)
Chloride: 106 mEq/L (ref 96–112)
Creatinine, Ser: 0.8 mg/dL (ref 0.4–1.5)
GFR: 105.41 mL/min (ref >60.00)
Glucose, Bld: 102 mg/dL — ABNORMAL HIGH (ref 70–99)
Potassium: 3.7 mEq/L (ref 3.5–5.1)
Sodium: 142 mEq/L (ref 135–145)
Total Bilirubin: 0.8 mg/dL (ref 0.3–1.2)
Total Protein: 7.1 g/dL (ref 6.0–8.3)

## 2012-10-14 LAB — MICROALBUMIN / CREATININE URINE RATIO
Creatinine,U: 110.5 mg/dL
Microalb Creat Ratio: 0.3 mg/g (ref 0.0–30.0)
Microalb, Ur: 0.3 mg/dL (ref 0.0–1.9)

## 2012-10-14 NOTE — Assessment & Plan Note (Signed)
BP Readings from Last 3 Encounters:  10/14/12 122/82  04/15/12 132/88  10/14/11 128/88   BP well controlled on current medication. Will continue. Renal function normal on labs today. Follow up 6 months.

## 2012-10-14 NOTE — Assessment & Plan Note (Signed)
Wt Readings from Last 3 Encounters:  10/14/12 267 lb (121.11 kg)  04/15/12 279 lb (126.554 kg)  10/14/11 272 lb 12 oz (123.719 kg)   Encouraged healthy diet and regular physical activity.

## 2012-10-14 NOTE — Progress Notes (Signed)
  Subjective:    Patient ID: Joseph Dalton, male    DOB: 08/18/1960, 52 y.o.   MRN: [id number]  HPI 52 year old male presents for followup. He reports he has been compliant with medication. He does not routinely check his blood pressure but when he does it as generally near 130/80. He denies any recent chest pain, palpitations, headache. He has been trying to limit caloric intake in an effort to lose weight. He does not follow any particular exercise program. He is active at work.  Outpatient Encounter Prescriptions as of 10/14/2012  Medication Sig Dispense Refill  . amLODipine-benazepril (LOTREL) 5-20 MG per capsule Take 1 capsule by mouth daily.  90 capsule  4  . atorvastatin (LIPITOR) 20 MG tablet Take 1 tablet (20 mg total) by mouth daily.  90 tablet  4  . hydrochlorothiazide (HYDRODIURIL) 25 MG tablet Take 1 tablet (25 mg total) by mouth daily.  90 tablet  4  . Multiple Vitamins-Minerals (MULTIVITAMIN WITH MINERALS) tablet Take 1 tablet by mouth daily.       No facility-administered encounter medications on file as of 10/14/2012.   BP 122/82  Pulse 64  Temp(Src) 98.2 F (36.8 C) (Oral)  Ht 6' 0.25" (1.835 m)  Wt 267 lb (121.11 kg)  BMI 35.97 kg/m2  SpO2 96%  Review of Systems  Constitutional: Negative for fever, chills, activity change, appetite change, fatigue and unexpected weight change.  Eyes: Negative for visual disturbance.  Respiratory: Negative for cough and shortness of breath.   Cardiovascular: Negative for chest pain, palpitations and leg swelling.  Gastrointestinal: Negative for abdominal pain and abdominal distention.  Genitourinary: Negative for dysuria, urgency and difficulty urinating.  Musculoskeletal: Negative for arthralgias and gait problem.  Skin: Negative for color change and rash.  Hematological: Negative for adenopathy.  Psychiatric/Behavioral: Negative for sleep disturbance and dysphoric mood. The patient is not nervous/anxious.        Objective:   Physical Exam  Constitutional: He is oriented to person, place, and time. He appears well-developed and well-nourished. No distress.  HENT:  Head: Normocephalic and atraumatic.  Right Ear: External ear normal.  Left Ear: External ear normal.  Nose: Nose normal.  Mouth/Throat: Oropharynx is clear and moist. No oropharyngeal exudate.  Eyes: Conjunctivae and EOM are normal. Pupils are equal, round, and reactive to light. Right eye exhibits no discharge. Left eye exhibits no discharge. No scleral icterus.  Neck: Normal range of motion. Neck supple. No tracheal deviation present. No thyromegaly present.  Cardiovascular: Normal rate, regular rhythm and normal heart sounds.  Exam reveals no gallop and no friction rub.   No murmur heard. Pulmonary/Chest: Effort normal and breath sounds normal. No respiratory distress. He has no wheezes. He has no rales. He exhibits no tenderness.  Musculoskeletal: Normal range of motion. He exhibits no edema.  Lymphadenopathy:    He has no cervical adenopathy.  Neurological: He is alert and oriented to person, place, and time. No cranial nerve deficit. Coordination normal.  Skin: Skin is warm and dry. No rash noted. He is not diaphoretic. No erythema. No pallor.  Psychiatric: He has a normal mood and affect. His behavior is normal. Judgment and thought content normal.          Assessment & Plan:

## 2012-10-14 NOTE — Assessment & Plan Note (Signed)
LFTs normal on labs today. Reviewed cholesterol from 04/2012 which was normal. Plan repeat cholesterol in 6 months. Continue Atorvastatin.

## 2013-02-11 ENCOUNTER — Other Ambulatory Visit: Payer: Self-pay

## 2013-04-13 ENCOUNTER — Telehealth: Payer: Self-pay | Admitting: Internal Medicine

## 2013-04-13 ENCOUNTER — Encounter: Payer: Self-pay | Admitting: Internal Medicine

## 2013-04-13 ENCOUNTER — Ambulatory Visit (INDEPENDENT_AMBULATORY_CARE_PROVIDER_SITE_OTHER): Payer: BC Managed Care – PPO | Admitting: Internal Medicine

## 2013-04-13 VITALS — BP 130/92 | HR 85 | Temp 98.6°F | Wt 273.0 lb

## 2013-04-13 DIAGNOSIS — J209 Acute bronchitis, unspecified: Secondary | ICD-10-CM

## 2013-04-13 MED ORDER — HYDROCOD POLST-CHLORPHEN POLST 10-8 MG/5ML PO LQCR: 5.0000 mL | Freq: Two times a day (BID) | ORAL | Status: DC | PRN

## 2013-04-13 MED ORDER — AZITHROMYCIN 250 MG PO TABS: ORAL_TABLET | ORAL | Status: DC

## 2013-04-13 NOTE — Progress Notes (Signed)
Pre-visit discussion using our clinic review tool. No additional management support is needed unless otherwise documented below in the visit note.  

## 2013-04-13 NOTE — Telephone Encounter (Signed)
Patient confirmed appointment time for 4:00 today

## 2013-04-13 NOTE — Telephone Encounter (Signed)
FYI to Dr. Walker 

## 2013-04-13 NOTE — Telephone Encounter (Signed)
We can work him in at [facility] [employer] if he likes.

## 2013-04-13 NOTE — Progress Notes (Signed)
   Subjective:    Patient ID: Joseph Dalton, male    DOB: 1961/01/29, 53 y.o.   MRN: [id number]  HPI 53 year old male presents for acute visit complaining of productive cough. He reports that symptoms began Friday with cough and congestion. Cough productive green sputum. No dyspnea. No chest pain. Brother was sick with similar symptoms. He denies fever or chills. He denies myalgia. He has been taking some over-the-counter medications with no improvement. He is not a smoker. No h/o asthma.  Outpatient Prescriptions Prior to Visit  Medication Sig Dispense Refill  . amLODipine-benazepril (LOTREL) 5-20 MG per capsule Take 1 capsule by mouth daily.  90 capsule  4  . atorvastatin (LIPITOR) 20 MG tablet Take 1 tablet (20 mg total) by mouth daily.  90 tablet  4  . hydrochlorothiazide (HYDRODIURIL) 25 MG tablet Take 1 tablet (25 mg total) by mouth daily.  90 tablet  4  . Multiple Vitamins-Minerals (MULTIVITAMIN WITH MINERALS) tablet Take 1 tablet by mouth daily.       No facility-administered medications prior to visit.   BP 130/92  Pulse 85  Temp(Src) 98.6 F (37 C) (Oral)  Wt 273 lb (123.832 kg)  SpO2 97%  Review of Systems  Constitutional: Negative for fever, chills, activity change and fatigue.  HENT: Positive for congestion, postnasal drip and rhinorrhea. Negative for ear discharge, ear pain, hearing loss, nosebleeds, sinus pressure, sneezing, sore throat, tinnitus, trouble swallowing and voice change.   Eyes: Negative for discharge, redness, itching and visual disturbance.  Respiratory: Positive for cough. Negative for chest tightness, shortness of breath, wheezing and stridor.   Cardiovascular: Negative for chest pain and leg swelling.  Musculoskeletal: Negative for arthralgias, myalgias, neck pain and neck stiffness.  Skin: Negative for color change and rash.  Neurological: Negative for dizziness, facial asymmetry and headaches.  Psychiatric/Behavioral: Negative for sleep disturbance.        Objective:   Physical Exam  Constitutional: He is oriented to person, place, and time. He appears well-developed and well-nourished. No distress.  HENT:  Head: Normocephalic and atraumatic.  Right Ear: External ear normal.  Left Ear: External ear normal.  Nose: Nose normal.  Mouth/Throat: Oropharynx is clear and moist. No oropharyngeal exudate.  Eyes: Conjunctivae and EOM are normal. Pupils are equal, round, and reactive to light. Right eye exhibits no discharge. Left eye exhibits no discharge. No scleral icterus.  Neck: Normal range of motion. Neck supple. No tracheal deviation present. No thyromegaly present.  Cardiovascular: Normal rate, regular rhythm and normal heart sounds.  Exam reveals no gallop and no friction rub.   No murmur heard. Pulmonary/Chest: Effort normal. No accessory muscle usage. Not tachypneic. No respiratory distress. He has no decreased breath sounds. He has no wheezes. Rhonchi: scatterd. He has no rales. He exhibits no tenderness.  Musculoskeletal: Normal range of motion. He exhibits no edema.  Lymphadenopathy:    He has no cervical adenopathy.  Neurological: He is alert and oriented to person, place, and time. No cranial nerve deficit. Coordination normal.  Skin: Skin is warm and dry. No rash noted. He is not diaphoretic. No erythema. No pallor.  Psychiatric: He has a normal mood and affect. His behavior is normal. Judgment and thought content normal.          Assessment & Plan:

## 2013-04-13 NOTE — Patient Instructions (Signed)

## 2013-04-13 NOTE — Telephone Encounter (Signed)
Patient Information:  Caller Name: Vennie  Phone: [phone]  Patient: Joseph Dalton, Joseph Dalton  Gender: Male  DOB: 07-22-1960  Age: 53 Years  PCP: Ronette Deter (Adults only)  Office Follow Up:  Does the office need to follow up with this patient?: No  Instructions For The Office: N/A   Symptoms  Reason For Call & Symptoms: Patient calling about cold symptoms onest 04/10/13. Cough produces yellow-green sputum. See Today or Tomorrow in Office per Cough protocol due to Continouous coughing interferes with work or school and no improvement using cough treatment.  Reviewed Health History In EMR: Yes  Reviewed Medications In EMR: Yes  Reviewed Allergies In EMR: Yes  Reviewed Surgeries / Procedures: Yes  Date of Onset of Symptoms: 04/10/2013  Treatments Tried: OTC Cold medication  Treatments Tried Worked: No  Guideline(s) Used:  Colds  Cough  Disposition Per Guideline:   See Today or Tomorrow in Office  Reason For Disposition Reached:   Continuous (nonstop) coughing interferes with work or school and no improvement using cough treatment per Care Advice  Advice Given:  Reassurance  Coughing is the way that our lungs remove irritants and mucus. It helps protect our lungs from getting pneumonia.  Here is some care advice that should help.  Cough Medicines:  OTC Cough Drops: Cough drops can help a lot, especially for mild coughs. They reduce coughing by soothing your irritated throat and removing that tickle sensation in the back of the throat. Cough drops also have the advantage of portability - you can carry them with you.  Home Remedy - Hard Candy: Hard candy works just as well as medicine-flavored OTC cough drops. Diabetics should use sugar-free candy.  Home Remedy - Honey: This old home remedy has been shown to help decrease coughing at night. The adult dosage is 2 teaspoons (10 ml) at bedtime. Honey should not be given to infants under one year of age.  Coughing Spasms:  Drink warm  fluids. Inhale warm mist (Reason: both relax the airway and loosen up the phlegm).  Suck on cough drops or hard candy to coat the irritated throat.  Prevent Dehydration:  Drink adequate liquids.  This will help soothe an irritated or dry throat and loosen up the phlegm.  Avoid Tobacco Smoke:  Smoking or being exposed to smoke makes coughs much worse.  Call Back If:  Difficulty breathing  You become worse.  Patient Refused Recommendation:  Patient Refused Care Advice  States he has appointment for 04/16/13.  He declined appointment for 04/14/13.

## 2013-04-14 NOTE — Assessment & Plan Note (Signed)
Symptoms most consistent with acute bronchitis. Will start azithromycin and use Tussionex as needed for cough. Encouraged him to use Tylenol or ibuprofen as needed for pain or fever. Encouraged rest and adequate fluid intake. Followup if symptoms are not improving over the next few days.

## 2013-04-16 ENCOUNTER — Encounter: Payer: Self-pay | Admitting: Internal Medicine

## 2013-04-16 ENCOUNTER — Ambulatory Visit (INDEPENDENT_AMBULATORY_CARE_PROVIDER_SITE_OTHER): Payer: BC Managed Care – PPO | Admitting: Internal Medicine

## 2013-04-16 VITALS — BP 120/80 | HR 69 | Temp 98.1°F | Wt 276.0 lb

## 2013-04-16 DIAGNOSIS — M25512 Pain in left shoulder: Secondary | ICD-10-CM | POA: Insufficient documentation

## 2013-04-16 DIAGNOSIS — M25519 Pain in unspecified shoulder: Secondary | ICD-10-CM

## 2013-04-16 DIAGNOSIS — Z125 Encounter for screening for malignant neoplasm of prostate: Secondary | ICD-10-CM

## 2013-04-16 DIAGNOSIS — E785 Hyperlipidemia, unspecified: Secondary | ICD-10-CM

## 2013-04-16 DIAGNOSIS — I10 Essential (primary) hypertension: Secondary | ICD-10-CM

## 2013-04-16 LAB — COMPREHENSIVE METABOLIC PANEL
ALT: 33 U/L (ref 0–53)
AST: 23 U/L (ref 0–37)
Albumin: 3.8 g/dL (ref 3.5–5.2)
Alkaline Phosphatase: 84 U/L (ref 39–117)
BUN: 15 mg/dL (ref 6–23)
CO2: 29 mEq/L (ref 19–32)
Calcium: 9 mg/dL (ref 8.4–10.5)
Chloride: 104 mEq/L (ref 96–112)
Creatinine, Ser: 0.9 mg/dL (ref 0.4–1.5)
GFR: 100.93 mL/min (ref >60.00)
Glucose, Bld: 92 mg/dL (ref 70–99)
Potassium: 4 mEq/L (ref 3.5–5.1)
Sodium: 138 mEq/L (ref 135–145)
Total Bilirubin: 0.8 mg/dL (ref 0.3–1.2)
Total Protein: 6.4 g/dL (ref 6.0–8.3)

## 2013-04-16 LAB — LIPID PANEL
Cholesterol: 155 mg/dL (ref 0–200)
HDL: 31.8 mg/dL — ABNORMAL LOW (ref >39.00)
LDL Cholesterol: 98 mg/dL (ref 0–99)
Total CHOL/HDL Ratio: 5
Triglycerides: 124 mg/dL (ref 0.0–149.0)
VLDL: 24.8 mg/dL (ref 0.0–40.0)

## 2013-04-16 LAB — MICROALBUMIN / CREATININE URINE RATIO
Creatinine,U: 134.8 mg/dL
Microalb Creat Ratio: 0.1 mg/g (ref 0.0–30.0)
Microalb, Ur: 0.2 mg/dL (ref 0.0–1.9)

## 2013-04-16 LAB — PSA: PSA: 0.63 ng/mL (ref 0.10–4.00)

## 2013-04-16 MED ORDER — AMLODIPINE BESY-BENAZEPRIL HCL 5-20 MG PO CAPS: 1.0000 | ORAL_CAPSULE | Freq: Every day | ORAL | Status: DC

## 2013-04-16 MED ORDER — ATORVASTATIN CALCIUM 20 MG PO TABS: 20.0000 mg | ORAL_TABLET | Freq: Every day | ORAL | Status: DC

## 2013-04-16 MED ORDER — HYDROCHLOROTHIAZIDE 25 MG PO TABS: 25.0000 mg | ORAL_TABLET | Freq: Every day | ORAL | Status: DC

## 2013-04-16 NOTE — Assessment & Plan Note (Signed)
BP Readings from Last 3 Encounters:  04/16/13 120/80  04/13/13 130/92  10/14/12 122/82   Sure well-controlled on current medications. Will continue. Will check renal function with labs today.

## 2013-04-16 NOTE — Progress Notes (Signed)
Pre-visit discussion using our clinic review tool. No additional management support is needed unless otherwise documented below in the visit note.  

## 2013-04-16 NOTE — Assessment & Plan Note (Signed)
Lipids well-controlled on atorvastatin. Will continue.

## 2013-04-16 NOTE — Progress Notes (Signed)
Subjective:    Patient ID: Joseph Dalton, male    DOB: [birth year], 53 y.o.   MRN: [id number]  HPI 53 year old male with history of hypertension, hyperlipidemia, and obesity presents for followup. He was recently seen in clinic this week for upper respiratory infection and bronchitis. He was treated with azithromycin. He reports significant improvement in symptoms of cough and shortness of breath. He denies any recurrent fever, chest pain. In regards to hypertension, he reports compliance with his medication. He denies any recent palpitations, chest pain, headache. He is concerned today about anterior left shoulder pain x about 1 month. Described as aching which is intermittent. Worsened with movement or with prolonged positioning of his arm. Tried Ibuprofen with some improvement. No swelling noted. No h/o trauma to his shoulder. No weakness or numbness in his left arm.  Outpatient Prescriptions Prior to Visit  Medication Sig Dispense Refill  . chlorpheniramine-HYDROcodone (TUSSIONEX) 10-8 MG/5ML LQCR Take 5 mLs by mouth every 12 (twelve) hours as needed for cough.  140 mL  0  . Multiple Vitamins-Minerals (MULTIVITAMIN WITH MINERALS) tablet Take 1 tablet by mouth daily.      Marland Kitchen amLODipine-benazepril (LOTREL) 5-20 MG per capsule Take 1 capsule by mouth daily.  90 capsule  4  . atorvastatin (LIPITOR) 20 MG tablet Take 1 tablet (20 mg total) by mouth daily.  90 tablet  4  . azithromycin (ZITHROMAX Z-PAK) 250 MG tablet Take 2 pills day 1, then 1 pill daily days 2-5  6 each  0  . hydrochlorothiazide (HYDRODIURIL) 25 MG tablet Take 1 tablet (25 mg total) by mouth daily.  90 tablet  4   No facility-administered medications prior to visit.    Review of Systems  Constitutional: Positive for fatigue. Negative for fever, chills, activity change, appetite change and unexpected weight change.  HENT: Positive for congestion, postnasal drip and rhinorrhea.   Eyes: Negative for visual disturbance.    Respiratory: Positive for cough. Negative for shortness of breath.   Cardiovascular: Negative for chest pain, palpitations and leg swelling.  Gastrointestinal: Negative for abdominal pain and abdominal distention.  Genitourinary: Negative for dysuria, urgency and difficulty urinating.  Musculoskeletal: Positive for arthralgias (left anterior shoulder). Negative for gait problem.  Skin: Negative for color change and rash.  Hematological: Negative for adenopathy.  Psychiatric/Behavioral: Negative for sleep disturbance and dysphoric mood. The patient is not nervous/anxious.        Objective:   Physical Exam  Constitutional: He is oriented to person, place, and time. He appears well-developed and well-nourished. No distress.  HENT:  Head: Normocephalic and atraumatic.  Right Ear: External ear normal.  Left Ear: External ear normal.  Nose: Nose normal.  Mouth/Throat: Oropharynx is clear and moist. No oropharyngeal exudate.  Eyes: Conjunctivae and EOM are normal. Pupils are equal, round, and reactive to light. Right eye exhibits no discharge. Left eye exhibits no discharge. No scleral icterus.  Neck: Normal range of motion. Neck supple. No tracheal deviation present. No thyromegaly present.  Cardiovascular: Normal rate, regular rhythm and normal heart sounds.  Exam reveals no gallop and no friction rub.   No murmur heard. Pulmonary/Chest: Effort normal and breath sounds normal. No respiratory distress. He has no wheezes. He has no rales. He exhibits no tenderness.  Musculoskeletal: Normal range of motion. He exhibits no edema.       Left shoulder: He exhibits pain. He exhibits normal range of motion, no tenderness, no bony tenderness and normal strength.  Lymphadenopathy:    He  has no cervical adenopathy.  Neurological: He is alert and oriented to person, place, and time. No cranial nerve deficit. Coordination normal.  Skin: Skin is warm and dry. No rash noted. He is not diaphoretic. No  erythema. No pallor.  Psychiatric: He has a normal mood and affect. His behavior is normal. Judgment and thought content normal.          Assessment & Plan:

## 2013-04-16 NOTE — Assessment & Plan Note (Signed)
Left shoulder pain x 1 month. Symptoms most consistent with osteoarthritis, however question if he may have tear of pectoralis given location of pain. Will set up sports med evaluation. Continue prn ibuprofen.

## 2013-04-21 ENCOUNTER — Ambulatory Visit (INDEPENDENT_AMBULATORY_CARE_PROVIDER_SITE_OTHER): Payer: BC Managed Care – PPO | Admitting: Family Medicine

## 2013-04-21 ENCOUNTER — Encounter: Payer: Self-pay | Admitting: Family Medicine

## 2013-04-21 ENCOUNTER — Other Ambulatory Visit (INDEPENDENT_AMBULATORY_CARE_PROVIDER_SITE_OTHER): Payer: BC Managed Care – PPO

## 2013-04-21 VITALS — BP 136/74 | HR 74 | Temp 98.2°F | Resp 16 | Wt 272.6 lb

## 2013-04-21 DIAGNOSIS — M25519 Pain in unspecified shoulder: Secondary | ICD-10-CM

## 2013-04-21 DIAGNOSIS — M75102 Unspecified rotator cuff tear or rupture of left shoulder, not specified as traumatic: Secondary | ICD-10-CM

## 2013-04-21 DIAGNOSIS — M25512 Pain in left shoulder: Secondary | ICD-10-CM

## 2013-04-21 DIAGNOSIS — M259 Joint disorder, unspecified: Secondary | ICD-10-CM

## 2013-04-21 DIAGNOSIS — M719 Bursopathy, unspecified: Secondary | ICD-10-CM

## 2013-04-21 DIAGNOSIS — M67919 Unspecified disorder of synovium and tendon, unspecified shoulder: Secondary | ICD-10-CM

## 2013-04-21 DIAGNOSIS — M19019 Primary osteoarthritis, unspecified shoulder: Secondary | ICD-10-CM | POA: Insufficient documentation

## 2013-04-21 MED ORDER — MELOXICAM 15 MG PO TABS: 15.0000 mg | ORAL_TABLET | Freq: Every day | ORAL | Status: DC

## 2013-04-21 NOTE — Progress Notes (Signed)
I'm seeing this patient by the request  of:  Rica Mast, MD   CC: Left shoulder pain  HPI: Patient is a very pleasant right-hand-dominant 53 year old male coming in with left shoulder pain. Patient states that the pain seems to be mostly anterior and has been around for approximately one month. Patient describes the pain is more of an intermittent sharp pains with certain movements. Patient also states that prolonged positioning of the arm overhead can give him some discomfort. Patient has tried over-the-counter anti-inflammatories with moderate improvement. Patient denies any swelling, any significant radiation of pain or any numbness. Patient does not remember any true injury to the shoulder with no history of trauma. Patient rates the severity about 5 at 10 in severity.   Past medical, surgical, family and social history reviewed. Medications reviewed all in the electronic medical record.   Review of Systems: No headache, visual changes, nausea, vomiting, diarrhea, constipation, dizziness, abdominal pain, skin rash, fevers, chills, night sweats, weight loss, swollen lymph nodes, body aches, joint swelling, muscle aches, chest pain, shortness of breath, mood changes.   Objective:    Blood pressure 136/74, pulse 74, temperature 98.2 F (36.8 C), temperature source Oral, resp. rate 16, weight 272 lb 9.6 oz (123.651 kg), SpO2 99.00%.   General: No apparent distress alert and oriented x3 mood and affect normal, dressed appropriately.  HEENT: Pupils equal, extraocular movements intact Respiratory: Patient's speak in full sentences and does not appear short of breath Cardiovascular: No lower extremity edema, non tender, no erythema Skin: Warm dry intact with no signs of infection or rash on extremities or on axial skeleton. Abdomen: Soft nontender Neuro: Cranial nerves II through XII are intact, neurovascularly intact in all extremities with 2+ DTRs and 2+ pulses. Lymph: No  lymphadenopathy of posterior or anterior cervical chain or axillae bilaterally.  Gait normal with good balance and coordination.  MSK: Non tender with full range of motion and good stability and symmetric strength and tone of shoulders, elbows, wrist, hip, knee and ankles bilaterally.  Shoulder: Inspection reveals no abnormalities, atrophy or asymmetry. Palpation is normal with mild tenderness over AC joint but none over bicipital groove. ROM is full in all planes. Patient does have pain with adduction Rotator cuff strength normal throughout.  positive  signs of impingement with Neer and Hawkin's tests,  negative empty can sign. Speeds and Yergason's tests normal.  mildlabral pathology noted with positive Obrien's,  negative clunk and good stability. Normal scapular function observed. No painful arc and no drop arm sign. No apprehension sign   MSK US performed of: Left shoulder This study was ordered, performed, and interpreted by Charlann Boxer D.O.  Shoulder:   Supraspinatus:  Appears normal on long and transverse views, no bursal bulge seen with shoulder abduction on impingement view. Infraspinatus:  Appears normal on long and transverse views. Subscapularis:  Significant bursitis noted. Patient also does have signs of impingement.. Teres Minor:  Appears normal on long and transverse views. AC joint:  significant arthritis as well as positive cap sign Glenohumeral Joint:  Appears normal without effusion. Glenoid Labrum:  mild degenerative changes  Biceps Tendon:  Appears normal on long and transverse views, no fraying of tendon, tendon located in intertubercular groove, no subluxation with shoulder internal or external rotation. No increased power doppler signal.  Procedure: Real-time Ultrasound Guided Injection of right glenohumeral joint Device: GE Logiq E  Ultrasound guided injection is preferred based studies that show increased duration, increased effect, greater accuracy,  decreased procedural pain,  increased response rate with ultrasound guided versus blind injection.  Verbal informed consent obtained.  Time-out conducted.  Noted no overlying erythema, induration, or other signs of local infection.  Skin prepped in a sterile fashion.  Local anesthesia: Topical Ethyl chloride.  With sterile technique and under real time ultrasound guidance:  Joint visualized.  23g 1  inch needle inserted posterior approach. Pictures taken for needle placement. Patient did have injection of 2 cc of 1% lidocaine, 2 cc of 0.5% Marcaine, and 1.0 cc of Kenalog 40 mg/dL. Completed without difficulty  Pain immediately resolved suggesting accurate placement of the medication.  Advised to call if fevers/chills, erythema, induration, drainage, or persistent bleeding.  Images permanently stored and available for review in the ultrasound unit.  Impression: Technically successful ultrasound guided injection.    Impression and Recommendations:     This case required medical decision making of moderate complexity.

## 2013-04-21 NOTE — Assessment & Plan Note (Signed)
Patient did have some mild subacromial bursitis this likely correspond to this. Patient did have an ultrasound-guided injection and this seemed to improve. Patient does have some degenerative changes of the labrum that could be also contributing. Patient does not have complete resolution of pain with these conservative therapy we may need consider further imaging including x-rays and potentially MRI. I am very optimistic this will not be necessary.

## 2013-04-21 NOTE — Patient Instructions (Addendum)
Good to meet you Try exercises most days of the week starting in 2 days Melxoicam daily for 10 days then as needed Come back and see me again in 3 weeks to make sure you are doing well.

## 2013-05-12 ENCOUNTER — Ambulatory Visit: Payer: BC Managed Care – PPO | Admitting: Family Medicine

## 2013-05-19 ENCOUNTER — Ambulatory Visit: Payer: BC Managed Care – PPO | Admitting: Family Medicine

## 2013-10-21 ENCOUNTER — Encounter: Payer: BC Managed Care – PPO | Admitting: Internal Medicine

## 2013-11-22 ENCOUNTER — Ambulatory Visit (INDEPENDENT_AMBULATORY_CARE_PROVIDER_SITE_OTHER): Payer: BC Managed Care – PPO | Admitting: Internal Medicine

## 2013-11-22 ENCOUNTER — Encounter: Payer: Self-pay | Admitting: Internal Medicine

## 2013-11-22 VITALS — BP 124/88 | HR 87 | Temp 98.3°F | Ht 73.0 in | Wt 256.8 lb

## 2013-11-22 DIAGNOSIS — I10 Essential (primary) hypertension: Secondary | ICD-10-CM

## 2013-11-22 DIAGNOSIS — E669 Obesity, unspecified: Secondary | ICD-10-CM

## 2013-11-22 DIAGNOSIS — Z0000 Encounter for general adult medical examination without abnormal findings: Secondary | ICD-10-CM | POA: Insufficient documentation

## 2013-11-22 NOTE — Progress Notes (Signed)
Pre visit review using our clinic review tool, if applicable. No additional management support is needed unless otherwise documented below in the visit note. 

## 2013-11-22 NOTE — Assessment & Plan Note (Signed)
BP Readings from Last 3 Encounters:  11/22/13 124/88  04/21/13 136/74  04/16/13 120/80   BP well controlled on current medication. Renal function with labs today.

## 2013-11-22 NOTE — Progress Notes (Signed)
Subjective:    Patient ID: Joseph Dalton, male    DOB: 06/17/1960, 53 y.o.   MRN: [id number]  HPI 53YO male presents for annual exam. Trying to lose weight. Taking [employer]bal[employer] shakes. Physically active at work. Feeling well with no concerns today.   Review of Systems  Constitutional: Negative for fever, chills, activity change, appetite change, fatigue and unexpected weight change.  Eyes: Negative for visual disturbance.  Respiratory: Negative for cough and shortness of breath.   Cardiovascular: Negative for chest pain, palpitations and leg swelling.  Gastrointestinal: Negative for nausea, vomiting, abdominal pain, diarrhea, constipation and abdominal distention.  Genitourinary: Negative for dysuria, urgency, decreased urine volume and difficulty urinating.  Musculoskeletal: Negative for arthralgias, gait problem and myalgias.  Skin: Negative for color change and rash.  Hematological: Negative for adenopathy.  Psychiatric/Behavioral: Negative for sleep disturbance and dysphoric mood. The patient is not nervous/anxious.        Objective:    BP 124/88  Pulse 87  Temp(Src) 98.3 F (36.8 C) (Oral)  Ht 6\' 1"  (1.854 m)  Wt 256 lb 12 oz (116.461 kg)  BMI 33.88 kg/m2  SpO2 98% Physical Exam  Constitutional: He is oriented to person, place, and time. He appears well-developed and well-nourished. No distress.  HENT:  Head: Normocephalic and atraumatic.  Right Ear: External ear normal.  Left Ear: External ear normal.  Nose: Nose normal.  Mouth/Throat: Oropharynx is clear and moist. No oropharyngeal exudate.  Eyes: Conjunctivae and EOM are normal. Pupils are equal, round, and reactive to light. Right eye exhibits no discharge. Left eye exhibits no discharge. No scleral icterus.  Neck: Normal range of motion. Neck supple. No tracheal deviation present. No thyromegaly present.  Cardiovascular: Normal rate, regular rhythm and normal heart sounds.  Exam reveals no gallop and no friction  rub.   No murmur heard. Pulmonary/Chest: Effort normal and breath sounds normal. No respiratory distress. He has no wheezes. He has no rales. He exhibits no tenderness.  Abdominal: Soft. Bowel sounds are normal. He exhibits no distension and no mass. There is no tenderness. There is no rebound and no guarding.  Musculoskeletal: Normal range of motion. He exhibits no edema.  Lymphadenopathy:    He has no cervical adenopathy.  Neurological: He is alert and oriented to person, place, and time. No cranial nerve deficit. Coordination normal.  Skin: Skin is warm and dry. No rash noted. He is not diaphoretic. No erythema. No pallor.  Psychiatric: He has a normal mood and affect. His behavior is normal. Judgment and thought content normal.          Assessment & Plan:   Problem List Items Addressed This Visit     Unprioritized   Hypertension      BP Readings from Last 3 Encounters:  11/22/13 124/88  04/21/13 136/74  04/16/13 120/80   BP well controlled on current medication. Renal function with labs today.    Obesity (BMI 30-39.9)      Wt Readings from Last 3 Encounters:  11/22/13 256 lb 12 oz (116.461 kg)  04/21/13 272 lb 9.6 oz (123.651 kg)  04/16/13 276 lb (125.193 kg)   Body mass index is 33.88 kg/(m^2). Congratulated pt on weight loss. Encouraged continued healthy diet and exercise.    Routine general medical examination at a health care facility - Primary     General medical exam normal today. Prostate exam deferred as recent prostate exam normal and pt asymptomatic. Will check labs including CBC, CMP, lipids, TSH,  PSA. Encouraged healthy diet and exercise. Immunizations UTD.    Relevant Orders      PSA, total and free      CBC with Differential      Comprehensive metabolic panel      Lipid panel      Microalbumin / creatinine urine ratio      Vit D  25 hydroxy (rtn osteoporosis monitoring)      TSH       Return in about 6 months (around 05/25/2014) for  Recheck.

## 2013-11-22 NOTE — Assessment & Plan Note (Signed)
General medical exam normal today. Prostate exam deferred as recent prostate exam normal and pt asymptomatic. Will check labs including CBC, CMP, lipids, TSH, PSA. Encouraged healthy diet and exercise. Immunizations UTD.

## 2013-11-22 NOTE — Patient Instructions (Signed)

## 2013-11-22 NOTE — Assessment & Plan Note (Signed)
Wt Readings from Last 3 Encounters:  11/22/13 256 lb 12 oz (116.461 kg)  04/21/13 272 lb 9.6 oz (123.651 kg)  04/16/13 276 lb (125.193 kg)   Body mass index is 33.88 kg/(m^2). Congratulated pt on weight loss. Encouraged continued healthy diet and exercise.

## 2013-11-23 ENCOUNTER — Telehealth: Payer: Self-pay | Admitting: Internal Medicine

## 2013-11-23 LAB — CBC WITH DIFFERENTIAL/PLATELET
Basophils Absolute: 0 10*3/uL (ref 0.0–0.1)
Basophils Relative: 0.5 % (ref 0.0–3.0)
Eosinophils Absolute: 0.1 10*3/uL (ref 0.0–0.7)
Eosinophils Relative: 1 % (ref 0.0–5.0)
HCT: 45.3 % (ref 39.0–52.0)
Hemoglobin: 15.1 g/dL (ref 13.0–17.0)
Lymphocytes Relative: 22.4 % (ref 12.0–46.0)
Lymphs Abs: 1.8 10*3/uL (ref 0.7–4.0)
MCHC: 33.4 g/dL (ref 30.0–36.0)
MCV: 89.2 fl (ref 78.0–100.0)
Monocytes Absolute: 0.6 10*3/uL (ref 0.1–1.0)
Monocytes Relative: 7.9 % (ref 3.0–12.0)
Neutro Abs: 5.5 10*3/uL (ref 1.4–7.7)
Neutrophils Relative %: 68.2 % (ref 43.0–77.0)
Platelets: 197 10*3/uL (ref 150.0–400.0)
RBC: 5.08 Mil/uL (ref 4.22–5.81)
RDW: 13.4 % (ref 11.5–15.5)
WBC: 8 10*3/uL (ref 4.0–10.5)

## 2013-11-23 LAB — COMPREHENSIVE METABOLIC PANEL
ALT: 36 U/L (ref 0–53)
AST: 27 U/L (ref 0–37)
Albumin: 4.1 g/dL (ref 3.5–5.2)
Alkaline Phosphatase: 84 U/L (ref 39–117)
BUN: 13 mg/dL (ref 6–23)
CO2: 30 mEq/L (ref 19–32)
Calcium: 9.2 mg/dL (ref 8.4–10.5)
Chloride: 104 mEq/L (ref 96–112)
Creatinine, Ser: 1 mg/dL (ref 0.4–1.5)
GFR: 85.44 mL/min (ref >60.00)
Glucose, Bld: 79 mg/dL (ref 70–99)
Potassium: 4 mEq/L (ref 3.5–5.1)
Sodium: 141 mEq/L (ref 135–145)
Total Bilirubin: 0.6 mg/dL (ref 0.2–1.2)
Total Protein: 7 g/dL (ref 6.0–8.3)

## 2013-11-23 LAB — MICROALBUMIN / CREATININE URINE RATIO
Creatinine,U: 124.8 mg/dL
Microalb Creat Ratio: 0.6 mg/g (ref 0.0–30.0)
Microalb, Ur: 0.7 mg/dL (ref 0.0–1.9)

## 2013-11-23 LAB — PSA, TOTAL AND FREE
PSA, Free Pct: 31 % (ref >25)
PSA, Free: 0.23 ng/mL
PSA: 0.75 ng/mL (ref <=4.00)

## 2013-11-23 LAB — LIPID PANEL
Cholesterol: 154 mg/dL (ref 0–200)
HDL: 38.3 mg/dL — ABNORMAL LOW (ref >39.00)
LDL Cholesterol: 81 mg/dL (ref 0–99)
NonHDL: 115.7
Total CHOL/HDL Ratio: 4
Triglycerides: 172 mg/dL — ABNORMAL HIGH (ref 0.0–149.0)
VLDL: 34.4 mg/dL (ref 0.0–40.0)

## 2013-11-23 LAB — TSH: TSH: 0.75 u[IU]/mL (ref 0.35–4.50)

## 2013-11-23 NOTE — Telephone Encounter (Signed)
Relevant patient education assigned to patient using Emmi. ° °

## 2013-11-24 ENCOUNTER — Encounter: Payer: BC Managed Care – PPO | Admitting: Internal Medicine

## 2013-11-24 LAB — VITAMIN D 25 HYDROXY (VIT D DEFICIENCY, FRACTURES): VITD: 39.64 ng/mL (ref 30.00–100.00)

## 2014-01-24 ENCOUNTER — Encounter: Payer: Self-pay | Admitting: Internal Medicine

## 2014-01-25 ENCOUNTER — Other Ambulatory Visit: Payer: Self-pay | Admitting: Internal Medicine

## 2014-01-25 MED ORDER — CYCLOBENZAPRINE HCL 10 MG PO TABS: 10.0000 mg | ORAL_TABLET | Freq: Three times a day (TID) | ORAL | Status: DC | PRN

## 2014-01-25 MED ORDER — ETODOLAC 500 MG PO TABS: 500.0000 mg | ORAL_TABLET | Freq: Two times a day (BID) | ORAL | Status: DC

## 2014-04-18 ENCOUNTER — Encounter: Payer: Self-pay | Admitting: Internal Medicine

## 2014-04-19 NOTE — Telephone Encounter (Signed)
Joseph Dalton, Please see attached email. We agreed to accept wife as a new patient, but I did not have a new patient visit this week.

## 2014-05-19 ENCOUNTER — Ambulatory Visit: Payer: BC Managed Care – PPO | Admitting: Internal Medicine

## 2014-06-03 ENCOUNTER — Telehealth: Payer: Self-pay | Admitting: Internal Medicine

## 2014-06-03 ENCOUNTER — Other Ambulatory Visit: Payer: Self-pay | Admitting: *Deleted

## 2014-06-03 DIAGNOSIS — I159 Secondary hypertension, unspecified: Secondary | ICD-10-CM

## 2014-06-03 DIAGNOSIS — E785 Hyperlipidemia, unspecified: Secondary | ICD-10-CM

## 2014-06-03 MED ORDER — AMLODIPINE BESY-BENAZEPRIL HCL 5-20 MG PO CAPS: 1.0000 | ORAL_CAPSULE | Freq: Every day | ORAL | Status: DC

## 2014-06-03 MED ORDER — ATORVASTATIN CALCIUM 20 MG PO TABS: 20.0000 mg | ORAL_TABLET | Freq: Every day | ORAL | Status: DC

## 2014-06-03 MED ORDER — HYDROCHLOROTHIAZIDE 25 MG PO TABS: 25.0000 mg | ORAL_TABLET | Freq: Every day | ORAL | Status: DC

## 2014-06-03 NOTE — Telephone Encounter (Signed)
Pt aware fax received

## 2014-06-03 NOTE — Telephone Encounter (Signed)
Patient stated that [facility] in [place] faxed over a request for a medication to be refilled, patient do not remember the name of medication. He want to know if you have received it. Please advise

## 2014-06-23 ENCOUNTER — Ambulatory Visit (INDEPENDENT_AMBULATORY_CARE_PROVIDER_SITE_OTHER): Payer: BLUE CROSS/BLUE SHIELD | Admitting: Internal Medicine

## 2014-06-23 ENCOUNTER — Encounter: Payer: Self-pay | Admitting: Internal Medicine

## 2014-06-23 VITALS — BP 135/86 | HR 58 | Temp 98.0°F | Ht 73.0 in | Wt 261.2 lb

## 2014-06-23 DIAGNOSIS — E669 Obesity, unspecified: Secondary | ICD-10-CM

## 2014-06-23 DIAGNOSIS — I159 Secondary hypertension, unspecified: Secondary | ICD-10-CM

## 2014-06-23 DIAGNOSIS — E785 Hyperlipidemia, unspecified: Secondary | ICD-10-CM

## 2014-06-23 DIAGNOSIS — I10 Essential (primary) hypertension: Secondary | ICD-10-CM

## 2014-06-23 MED ORDER — HYDROCHLOROTHIAZIDE 25 MG PO TABS: 25.0000 mg | ORAL_TABLET | Freq: Every day | ORAL | Status: DC

## 2014-06-23 MED ORDER — ATORVASTATIN CALCIUM 20 MG PO TABS: 20.0000 mg | ORAL_TABLET | Freq: Every day | ORAL | Status: DC

## 2014-06-23 MED ORDER — AMLODIPINE BESY-BENAZEPRIL HCL 5-20 MG PO CAPS: 1.0000 | ORAL_CAPSULE | Freq: Every day | ORAL | Status: DC

## 2014-06-23 NOTE — Progress Notes (Signed)
Subjective:    Patient ID: Joseph Dalton, male    DOB: 1960/06/30, 54 y.o.   MRN: [id number]  HPI  54YO male presents for followup.  Last seen 11/2013 for general exam.  Feeling well. No concerns today. Compliant with medication.   BP Readings from Last 3 Encounters:  06/23/14 135/86  11/22/13 124/88  04/21/13 136/74    Wt Readings from Last 3 Encounters:  06/23/14 261 lb 4 oz (118.502 kg)  11/22/13 256 lb 12 oz (116.461 kg)  04/21/13 272 lb 9.6 oz (123.651 kg)    Past medical, surgical, family and social history per today's encounter.  Review of Systems  Constitutional: Negative for fever, chills, activity change, appetite change, fatigue and unexpected weight change.  Eyes: Negative for visual disturbance.  Respiratory: Negative for cough and shortness of breath.   Cardiovascular: Negative for chest pain, palpitations and leg swelling.  Gastrointestinal: Negative for nausea, vomiting, abdominal pain, diarrhea, constipation and abdominal distention.  Genitourinary: Negative for dysuria, urgency and difficulty urinating.  Musculoskeletal: Negative for arthralgias and gait problem.  Skin: Negative for color change and rash.  Hematological: Negative for adenopathy.  Psychiatric/Behavioral: Negative for sleep disturbance and dysphoric mood. The patient is not nervous/anxious.        Objective:    BP 135/86 mmHg  Pulse 58  Temp(Src) 98 F (36.7 C) (Oral)  Ht 6\' 1"  (1.854 m)  Wt 261 lb 4 oz (118.502 kg)  BMI 34.48 kg/m2  SpO2 97% Physical Exam  Constitutional: He is oriented to person, place, and time. He appears well-developed and well-nourished. No distress.  HENT:  Head: Normocephalic and atraumatic.  Right Ear: External ear normal.  Left Ear: External ear normal.  Nose: Nose normal.  Mouth/Throat: Oropharynx is clear and moist. No oropharyngeal exudate.  Eyes: Conjunctivae and EOM are normal. Pupils are equal, round, and reactive to light. Right eye exhibits  no discharge. Left eye exhibits no discharge. No scleral icterus.  Neck: Normal range of motion. Neck supple. No tracheal deviation present. No thyromegaly present.  Cardiovascular: Normal rate, regular rhythm and normal heart sounds.  Exam reveals no gallop and no friction rub.   No murmur heard. Pulmonary/Chest: Effort normal and breath sounds normal. No accessory muscle usage. No tachypnea. No respiratory distress. He has no decreased breath sounds. He has no wheezes. He has no rhonchi. He has no rales. He exhibits no tenderness.  Musculoskeletal: Normal range of motion. He exhibits no edema.  Lymphadenopathy:    He has no cervical adenopathy.  Neurological: He is alert and oriented to person, place, and time. No cranial nerve deficit. Coordination normal.  Skin: Skin is warm and dry. No rash noted. He is not diaphoretic. No erythema. No pallor.  Psychiatric: He has a normal mood and affect. His behavior is normal. Judgment and thought content normal.          Assessment & Plan:   Problem List Items Addressed This Visit      Unprioritized   Hyperlipidemia    Will check lipids and LFTs with labs.      Relevant Medications   amLODipine-benazepril (LOTREL) 5-20 MG per capsule   atorvastatin (LIPITOR) tablet   hydrochlorothiazide tablet   Other Relevant Orders   Comprehensive metabolic panel   Hypertension - Primary    BP Readings from Last 3 Encounters:  06/23/14 135/86  11/22/13 124/88  04/21/13 136/74   BP well controlled. Renal function with labs. Continue current medications.  Relevant Medications   amLODipine-benazepril (LOTREL) 5-20 MG per capsule   atorvastatin (LIPITOR) tablet   hydrochlorothiazide tablet   Obesity (BMI 30-39.9)    Wt Readings from Last 3 Encounters:  06/23/14 261 lb 4 oz (118.502 kg)  11/22/13 256 lb 12 oz (116.461 kg)  04/21/13 272 lb 9.6 oz (123.651 kg)   Encouraged healthy diet and exercise with goal of weight loss.           Return in about 6 months (around 12/24/2014) for Physical.

## 2014-06-23 NOTE — Patient Instructions (Signed)
Labs when convenient.  Continue current medications.

## 2014-06-23 NOTE — Assessment & Plan Note (Signed)
BP Readings from Last 3 Encounters:  06/23/14 135/86  11/22/13 124/88  04/21/13 136/74   BP well controlled. Renal function with labs. Continue current medications.

## 2014-06-23 NOTE — Assessment & Plan Note (Signed)
Will check lipids and LFTs with labs. 

## 2014-06-23 NOTE — Progress Notes (Signed)
Pre visit review using our clinic review tool, if applicable. No additional management support is needed unless otherwise documented below in the visit note. 

## 2014-06-23 NOTE — Assessment & Plan Note (Signed)
Wt Readings from Last 3 Encounters:  06/23/14 261 lb 4 oz (118.502 kg)  11/22/13 256 lb 12 oz (116.461 kg)  04/21/13 272 lb 9.6 oz (123.651 kg)   Encouraged healthy diet and exercise with goal of weight loss.

## 2014-06-24 ENCOUNTER — Telehealth: Payer: Self-pay | Admitting: Internal Medicine

## 2014-06-24 NOTE — Telephone Encounter (Signed)
emmi emailed °

## 2014-07-08 ENCOUNTER — Other Ambulatory Visit (INDEPENDENT_AMBULATORY_CARE_PROVIDER_SITE_OTHER): Payer: BLUE CROSS/BLUE SHIELD

## 2014-07-08 DIAGNOSIS — E785 Hyperlipidemia, unspecified: Secondary | ICD-10-CM | POA: Diagnosis not present

## 2014-07-08 LAB — COMPREHENSIVE METABOLIC PANEL
ALT: 32 U/L (ref 0–53)
AST: 24 U/L (ref 0–37)
Albumin: 3.9 g/dL (ref 3.5–5.2)
Alkaline Phosphatase: 82 U/L (ref 39–117)
BUN: 21 mg/dL (ref 6–23)
CO2: 30 mEq/L (ref 19–32)
Calcium: 9.1 mg/dL (ref 8.4–10.5)
Chloride: 105 mEq/L (ref 96–112)
Creatinine, Ser: 0.85 mg/dL (ref 0.40–1.50)
GFR: 100.44 mL/min (ref >60.00)
Glucose, Bld: 104 mg/dL — ABNORMAL HIGH (ref 70–99)
Potassium: 4.1 mEq/L (ref 3.5–5.1)
Sodium: 139 mEq/L (ref 135–145)
Total Bilirubin: 0.7 mg/dL (ref 0.2–1.2)
Total Protein: 6.5 g/dL (ref 6.0–8.3)

## 2014-07-12 ENCOUNTER — Encounter: Payer: Self-pay | Admitting: Internal Medicine

## 2014-09-22 ENCOUNTER — Ambulatory Visit: Payer: BLUE CROSS/BLUE SHIELD | Admitting: Internal Medicine

## 2014-12-28 ENCOUNTER — Encounter: Payer: Self-pay | Admitting: Internal Medicine

## 2014-12-28 ENCOUNTER — Ambulatory Visit (INDEPENDENT_AMBULATORY_CARE_PROVIDER_SITE_OTHER): Payer: BLUE CROSS/BLUE SHIELD | Admitting: Internal Medicine

## 2014-12-28 VITALS — BP 139/87 | HR 57 | Temp 98.1°F | Ht 73.0 in | Wt 275.0 lb

## 2014-12-28 DIAGNOSIS — Z0000 Encounter for general adult medical examination without abnormal findings: Secondary | ICD-10-CM

## 2014-12-28 DIAGNOSIS — E785 Hyperlipidemia, unspecified: Secondary | ICD-10-CM | POA: Diagnosis not present

## 2014-12-28 DIAGNOSIS — I10 Essential (primary) hypertension: Secondary | ICD-10-CM

## 2014-12-28 LAB — COMPREHENSIVE METABOLIC PANEL
ALT: 44 U/L (ref 0–53)
AST: 29 U/L (ref 0–37)
Albumin: 4.1 g/dL (ref 3.5–5.2)
Alkaline Phosphatase: 93 U/L (ref 39–117)
BUN: 21 mg/dL (ref 6–23)
CO2: 31 mEq/L (ref 19–32)
Calcium: 9.4 mg/dL (ref 8.4–10.5)
Chloride: 103 mEq/L (ref 96–112)
Creatinine, Ser: 0.79 mg/dL (ref 0.40–1.50)
GFR: 109.1 mL/min (ref >60.00)
Glucose, Bld: 88 mg/dL (ref 70–99)
Potassium: 4.1 mEq/L (ref 3.5–5.1)
Sodium: 140 mEq/L (ref 135–145)
Total Bilirubin: 0.5 mg/dL (ref 0.2–1.2)
Total Protein: 6.7 g/dL (ref 6.0–8.3)

## 2014-12-28 LAB — CBC WITH DIFFERENTIAL/PLATELET
Basophils Absolute: 0 10*3/uL (ref 0.0–0.1)
Basophils Relative: 0.4 % (ref 0.0–3.0)
Eosinophils Absolute: 0.2 10*3/uL (ref 0.0–0.7)
Eosinophils Relative: 3.7 % (ref 0.0–5.0)
HCT: 45.8 % (ref 39.0–52.0)
Hemoglobin: 15.5 g/dL (ref 13.0–17.0)
Lymphocytes Relative: 25.3 % (ref 12.0–46.0)
Lymphs Abs: 1.7 10*3/uL (ref 0.7–4.0)
MCHC: 33.9 g/dL (ref 30.0–36.0)
MCV: 87.8 fl (ref 78.0–100.0)
Monocytes Absolute: 0.5 10*3/uL (ref 0.1–1.0)
Monocytes Relative: 8.2 % (ref 3.0–12.0)
Neutro Abs: 4.1 10*3/uL (ref 1.4–7.7)
Neutrophils Relative %: 62.4 % (ref 43.0–77.0)
Platelets: 193 10*3/uL (ref 150.0–400.0)
RBC: 5.22 Mil/uL (ref 4.22–5.81)
RDW: 13 % (ref 11.5–15.5)
WBC: 6.7 10*3/uL (ref 4.0–10.5)

## 2014-12-28 LAB — LIPID PANEL
Cholesterol: 185 mg/dL (ref 0–200)
HDL: 37.3 mg/dL — ABNORMAL LOW (ref >39.00)
LDL Cholesterol: 115 mg/dL — ABNORMAL HIGH (ref 0–99)
NonHDL: 148.16
Total CHOL/HDL Ratio: 5
Triglycerides: 165 mg/dL — ABNORMAL HIGH (ref 0.0–149.0)
VLDL: 33 mg/dL (ref 0.0–40.0)

## 2014-12-28 LAB — VITAMIN D 25 HYDROXY (VIT D DEFICIENCY, FRACTURES): VITD: 30.83 ng/mL (ref 30.00–100.00)

## 2014-12-28 LAB — MICROALBUMIN / CREATININE URINE RATIO
Creatinine,U: 81.5 mg/dL
Microalb Creat Ratio: 0.9 mg/g (ref 0.0–30.0)
Microalb, Ur: <0.7 mg/dL (ref 0.0–1.9)

## 2014-12-28 MED ORDER — ATORVASTATIN CALCIUM 20 MG PO TABS: 20.0000 mg | ORAL_TABLET | Freq: Every day | ORAL | Status: DC

## 2014-12-28 MED ORDER — HYDROCHLOROTHIAZIDE 25 MG PO TABS: 25.0000 mg | ORAL_TABLET | Freq: Every day | ORAL | Status: DC

## 2014-12-28 MED ORDER — AMLODIPINE BESY-BENAZEPRIL HCL 5-20 MG PO CAPS: 1.0000 | ORAL_CAPSULE | Freq: Every day | ORAL | Status: DC

## 2014-12-28 NOTE — Progress Notes (Signed)
Subjective:    Patient ID: Joseph Dalton, male    DOB: [birth year], 54 y.o.   MRN: [id number]  HPI  54YO male presents for annual exam.  Feeling well. Notes he has not been following diet as strictly. Notes increased consumption of skim milk, up to 2gal per week. Physically active at work.    Wt Readings from Last 3 Encounters:  12/28/14 275 lb (124.739 kg)  06/23/14 261 lb 4 oz (118.502 kg)  11/22/13 256 lb 12 oz (116.461 kg)   BP Readings from Last 3 Encounters:  12/28/14 139/87  06/23/14 135/86  11/22/13 124/88     Past Medical History  Diagnosis Date  . Hyperlipemia   . HTN (hypertension)   . Venous insufficiency    Family History  Problem Relation Age of Onset  . Stroke Mother   . Hyperlipidemia Mother   . Heart disease Father 12    heart attack   No past surgical history on file. Social History   Social History  . Marital Status: Married    Spouse Name: N/A  . Number of Children: 1  . Years of Education: N/A   Occupational History  . Self Employed - Teaching laboratory technician    Social History Main Topics  . Smoking status: Never Smoker   . Smokeless tobacco: Current User    Types: Chew  . Alcohol Use: Yes     Comment: Occasional  . Drug Use: No  . Sexual Activity: Not Asked   Other Topics Concern  . None   Social History Narrative   Lives in [place] with wife, 1 daughter. Dog.  Works as Psychiatric nurse.       Regular Exercise -  NO   Daily Caffeine Use:  1 cup coffee and 1 soda a day    Review of Systems  Constitutional: Negative for fever, chills, activity change, appetite change, fatigue and unexpected weight change.  Eyes: Negative for visual disturbance.  Respiratory: Negative for cough and shortness of breath.   Cardiovascular: Negative for chest pain, palpitations and leg swelling.  Gastrointestinal: Negative for nausea, vomiting, abdominal pain, diarrhea, constipation and abdominal distention.  Genitourinary: Negative for  dysuria, urgency and difficulty urinating.  Musculoskeletal: Negative for myalgias, arthralgias and gait problem.  Skin: Negative for color change and rash.  Hematological: Negative for adenopathy.  Psychiatric/Behavioral: Negative for sleep disturbance and dysphoric mood. The patient is not nervous/anxious.        Objective:    BP 139/87 mmHg  Pulse 57  Temp(Src) 98.1 F (36.7 C) (Oral)  Ht 6\' 1"  (1.854 m)  Wt 275 lb (124.739 kg)  BMI 36.29 kg/m2  SpO2 96% Physical Exam  Constitutional: He is oriented to person, place, and time. He appears well-developed and well-nourished. No distress.  HENT:  Head: Normocephalic and atraumatic.  Right Ear: External ear normal.  Left Ear: External ear normal.  Nose: Nose normal.  Mouth/Throat: Oropharynx is clear and moist. No oropharyngeal exudate.  Eyes: Conjunctivae and EOM are normal. Pupils are equal, round, and reactive to light. Right eye exhibits no discharge. Left eye exhibits no discharge. No scleral icterus.  Neck: Normal range of motion. Neck supple. No tracheal deviation present. No thyromegaly present.  Cardiovascular: Normal rate, regular rhythm and normal heart sounds.  Exam reveals no gallop and no friction rub.   No murmur heard. Pulmonary/Chest: Effort normal and breath sounds normal. No respiratory distress. He has no wheezes. He has no rales. He exhibits no tenderness.  Abdominal: Soft. Bowel sounds are normal. He exhibits no distension and no mass. There is no tenderness. There is no rebound and no guarding.  Musculoskeletal: Normal range of motion. He exhibits no edema.  Lymphadenopathy:    He has no cervical adenopathy.  Neurological: He is alert and oriented to person, place, and time. No cranial nerve deficit. Coordination normal.  Skin: Skin is warm and dry. No rash noted. He is not diaphoretic. No erythema. No pallor.  Psychiatric: He has a normal mood and affect. His behavior is normal. Judgment and thought content  normal.          Assessment & Plan:   Problem List Items Addressed This Visit      Unprioritized   Hyperlipidemia   Relevant Medications   atorvastatin (LIPITOR) 20 MG tablet   amLODipine-benazepril (LOTREL) 5-20 MG per capsule   hydrochlorothiazide (HYDRODIURIL) 25 MG tablet   Hypertension   Relevant Medications   atorvastatin (LIPITOR) 20 MG tablet   amLODipine-benazepril (LOTREL) 5-20 MG per capsule   hydrochlorothiazide (HYDRODIURIL) 25 MG tablet   Routine general medical examination at a health care facility - Primary    General medical exam normal today. Labs as ordered. Discussed limitations of PSA testing. Flu vaccine declined. Colonoscopy UTD, next due 2019. Encouraged healthy diet and exercise.      Relevant Orders   CBC with Differential/Platelet   Lipid panel   Comprehensive metabolic panel   Microalbumin / creatinine urine ratio   Vit D  25 hydroxy (rtn osteoporosis monitoring)   PSA, total and free       Return in about 6 months (around 06/27/2015) for Recheck of Blood Pressure.

## 2014-12-28 NOTE — Patient Instructions (Signed)

## 2014-12-28 NOTE — Progress Notes (Signed)
Pre visit review using our clinic review tool, if applicable. No additional management support is needed unless otherwise documented below in the visit note. 

## 2014-12-28 NOTE — Assessment & Plan Note (Signed)
General medical exam normal today. Labs as ordered. Discussed limitations of PSA testing. Flu vaccine declined. Colonoscopy UTD, next due 2019. Encouraged healthy diet and exercise.

## 2014-12-29 LAB — PSA, TOTAL AND FREE
PSA, Free Pct: 32 % (ref >25)
PSA, Free: 0.27 ng/mL
PSA: 0.85 ng/mL (ref <=4.00)

## 2015-01-04 MED ORDER — HYDROCHLOROTHIAZIDE 25 MG PO TABS: 25.0000 mg | ORAL_TABLET | Freq: Every day | ORAL | Status: DC

## 2015-01-04 MED ORDER — AMLODIPINE BESY-BENAZEPRIL HCL 5-20 MG PO CAPS: 1.0000 | ORAL_CAPSULE | Freq: Every day | ORAL | Status: DC

## 2015-01-04 NOTE — Addendum Note (Signed)
Addended by: Ronette Deter A on: 01/04/2015 01:09 PM   Modules accepted: Orders, SmartSet

## 2015-01-04 NOTE — Addendum Note (Signed)
Addended by: Rayann Heman L on: 01/04/2015 09:05 AM   Modules accepted: SmartSet

## 2015-05-11 ENCOUNTER — Encounter: Payer: Self-pay | Admitting: Internal Medicine

## 2015-05-11 ENCOUNTER — Other Ambulatory Visit: Payer: Self-pay | Admitting: Internal Medicine

## 2015-05-11 ENCOUNTER — Ambulatory Visit (INDEPENDENT_AMBULATORY_CARE_PROVIDER_SITE_OTHER): Payer: BLUE CROSS/BLUE SHIELD | Admitting: Internal Medicine

## 2015-05-11 ENCOUNTER — Telehealth: Payer: Self-pay | Admitting: Internal Medicine

## 2015-05-11 ENCOUNTER — Ambulatory Visit
Admission: RE | Admit: 2015-05-11 | Discharge: 2015-05-11 | Disposition: A | Discharge status: Home / Self Care | Payer: BLUE CROSS/BLUE SHIELD | Source: Ambulatory Visit | Attending: Internal Medicine | Admitting: Internal Medicine

## 2015-05-11 VITALS — BP 117/80 | HR 84 | Temp 98.2°F | Ht 73.0 in | Wt 272.4 lb

## 2015-05-11 DIAGNOSIS — R29898 Other symptoms and signs involving the musculoskeletal system: Secondary | ICD-10-CM

## 2015-05-11 DIAGNOSIS — M545 Low back pain, unspecified: Secondary | ICD-10-CM

## 2015-05-11 DIAGNOSIS — N200 Calculus of kidney: Secondary | ICD-10-CM | POA: Diagnosis not present

## 2015-05-11 DIAGNOSIS — M47816 Spondylosis without myelopathy or radiculopathy, lumbar region: Secondary | ICD-10-CM | POA: Insufficient documentation

## 2015-05-11 DIAGNOSIS — M5386 Other specified dorsopathies, lumbar region: Secondary | ICD-10-CM | POA: Insufficient documentation

## 2015-05-11 DIAGNOSIS — M5432 Sciatica, left side: Secondary | ICD-10-CM

## 2015-05-11 DIAGNOSIS — M79605 Pain in left leg: Secondary | ICD-10-CM

## 2015-05-11 MED ORDER — HYDROCODONE-ACETAMINOPHEN 5-325 MG PO TABS: 1.0000 | ORAL_TABLET | Freq: Three times a day (TID) | ORAL | Status: DC | PRN

## 2015-05-11 MED ORDER — PREDNISONE 10 MG PO TABS: ORAL_TABLET | ORAL | Status: DC

## 2015-05-11 NOTE — Progress Notes (Signed)
Subjective:    Patient ID: Joseph Dalton, male    DOB: Mar 11, 1961, 55 y.o.   MRN: [id number]  HPI  55YO male presents for acute visit.  Back pain - Started yesterday about 10am. Located lower left side. At times sharp. Worsened with movement and cough. Radiates down to left leg and groin. Had similar symptoms in past with more pain in groin, which was testicular? Infection. Majority of pain now in lower back.  Feels at times that left leg is weak. Occasional numbness.  Taking Flexeril and Lodine with no improvement. No recent injuries. No new activities. No fever. No hematuria. No dysuria.   Wt Readings from Last 3 Encounters:  05/11/15 272 lb 6 oz (123.548 kg)  12/28/14 275 lb (124.739 kg)  06/23/14 261 lb 4 oz (118.502 kg)   BP Readings from Last 3 Encounters:  05/11/15 117/80  12/28/14 139/87  06/23/14 135/86    Past Medical History  Diagnosis Date  . Hyperlipemia   . HTN (hypertension)   . Venous insufficiency    Family History  Problem Relation Age of Onset  . Stroke Mother   . Hyperlipidemia Mother   . Heart disease Father 67    heart attack   No past surgical history on file. Social History   Social History  . Marital Status: Married    Spouse Name: N/A  . Number of Children: 1  . Years of Education: N/A   Occupational History  . Self Employed - Teaching laboratory technician    Social History Main Topics  . Smoking status: Never Smoker   . Smokeless tobacco: Current User    Types: Chew  . Alcohol Use: Yes     Comment: Occasional  . Drug Use: No  . Sexual Activity: Not Asked   Other Topics Concern  . None   Social History Narrative   Lives in [place] with wife, 1 daughter. Dog.  Works as Psychiatric nurse.       Regular Exercise -  NO   Daily Caffeine Use:  1 cup coffee and 1 soda a day    Review of Systems  Constitutional: Negative for fever, chills, activity change, appetite change, fatigue and unexpected weight change.  Eyes: Negative for  visual disturbance.  Respiratory: Negative for cough and shortness of breath.   Cardiovascular: Negative for chest pain, palpitations and leg swelling.  Gastrointestinal: Negative for abdominal pain and abdominal distention.  Genitourinary: Negative for dysuria, urgency and difficulty urinating.  Musculoskeletal: Positive for myalgias, back pain, arthralgias and gait problem.  Skin: Negative for color change and rash.  Neurological: Positive for weakness and numbness. Negative for dizziness and headaches.  Hematological: Negative for adenopathy.  Psychiatric/Behavioral: Negative for sleep disturbance and dysphoric mood. The patient is not nervous/anxious.        Objective:    BP 117/80 mmHg  Pulse 84  Temp(Src) 98.2 F (36.8 C) (Oral)  Ht 6\' 1"  (1.854 m)  Wt 272 lb 6 oz (123.548 kg)  BMI 35.94 kg/m2  SpO2 99% Physical Exam  Constitutional: He is oriented to person, place, and time. He appears well-developed and well-nourished. No distress.  HENT:  Head: Normocephalic and atraumatic.  Right Ear: External ear normal.  Left Ear: External ear normal.  Nose: Nose normal.  Mouth/Throat: Oropharynx is clear and moist.  Eyes: Conjunctivae and EOM are normal. Pupils are equal, round, and reactive to light. Right eye exhibits no discharge. Left eye exhibits no discharge. No scleral icterus.  Neck: Normal  range of motion. Neck supple. No tracheal deviation present. No thyromegaly present.  Cardiovascular: Normal rate, regular rhythm and normal heart sounds.  Exam reveals no gallop and no friction rub.   No murmur heard. Pulmonary/Chest: Effort normal and breath sounds normal. No accessory muscle usage. No tachypnea. No respiratory distress. He has no decreased breath sounds. He has no wheezes. He has no rhonchi. He has no rales. He exhibits no tenderness.  Musculoskeletal: He exhibits no edema.       Lumbar back: He exhibits decreased range of motion and pain. He exhibits no tenderness, no  bony tenderness, no swelling and no deformity.       Back:  Lymphadenopathy:    He has no cervical adenopathy.  Neurological: He is alert and oriented to person, place, and time. He displays no atrophy. A sensory deficit is present. No cranial nerve deficit. He exhibits normal muscle tone. Coordination normal.  Skin: Skin is warm and dry. No rash noted. He is not diaphoretic. No erythema. No pallor.  Psychiatric: He has a normal mood and affect. His behavior is normal. Judgment and thought content normal.          Assessment & Plan:   Problem List Items Addressed This Visit      Unprioritized   Left sciatic nerve pain - Primary    Exam is most consistent with left sciatic pain. Will start Prednisone taper and prn Hydrocodone. Given this is a recurrent issue, will get plain xray. Discussed that physical therapy may be helpful. Also discussed that MRI may be necessary. Follow up after xray.      Relevant Orders   DG Lumbar Spine Complete       Return in about 1 week (around 05/18/2015) for Recheck.

## 2015-05-11 NOTE — Telephone Encounter (Signed)
Pt wife called about her husband having Lower back towards left/cough/in pain. Pt does not want to see any other provider. Wife did ask to put him on another provider schedule and still send a message. Call pt @ [phone]. Thank you!

## 2015-05-11 NOTE — Assessment & Plan Note (Signed)
Exam is most consistent with left sciatic pain. Will start Prednisone taper and prn Hydrocodone. Given this is a recurrent issue, will get plain xray. Discussed that physical therapy may be helpful. Also discussed that MRI may be necessary. Follow up after xray.

## 2015-05-11 NOTE — Progress Notes (Signed)
Pre visit review using our clinic review tool, if applicable. No additional management support is needed unless otherwise documented below in the visit note. 

## 2015-05-11 NOTE — Patient Instructions (Signed)
Start Prednisone taper.  Start Hydrocodone every 8 hours as needed for pain. This medication will make you drowsy.  Xray of lower back today.  Email with update tonight or tomorrow morning.  Follow up 1 week.

## 2015-05-13 ENCOUNTER — Ambulatory Visit
Admission: RE | Admit: 2015-05-13 | Discharge: 2015-05-13 | Disposition: A | Discharge status: Home / Self Care | Payer: BLUE CROSS/BLUE SHIELD | Source: Ambulatory Visit | Attending: Internal Medicine | Admitting: Internal Medicine

## 2015-05-13 DIAGNOSIS — M545 Low back pain: Secondary | ICD-10-CM

## 2015-05-15 ENCOUNTER — Telehealth: Payer: Self-pay

## 2015-05-15 ENCOUNTER — Telehealth: Payer: Self-pay | Admitting: Internal Medicine

## 2015-05-15 DIAGNOSIS — M545 Low back pain: Secondary | ICD-10-CM

## 2015-05-15 NOTE — Telephone Encounter (Signed)
Notified the pt of Dr.Walkers comment and also stated that I called and got him appointment with Dr. Durene Cal Feb.17, I spoke with Levada Dy [phone]

## 2015-05-15 NOTE — Telephone Encounter (Signed)
Pt called about wanting to speak with [name] regarding what his next step is? Pt went to [facility] imaging on Saturday and wants to discuss what happens after that? Call pt @ [phone]. Thank you!

## 2015-05-15 NOTE — Telephone Encounter (Signed)
Patient stated that the preferred provider for the neurosurgery Dr.Jenkins at [facility] in [place] Patient requested a call from Dr. Rockne Menghini [phone]

## 2015-05-15 NOTE — Telephone Encounter (Signed)
Pt is severe pain level 10 and wants a referral to be seen by Dr. Arnoldo Morale at [facility] in [place]. Please advise

## 2015-05-15 NOTE — Telephone Encounter (Signed)
Dr. Gilford Rile have you had a chance to review this pt's MRI spine results. Pt is calling to find out what the next step is? Please advise

## 2015-05-15 NOTE — Telephone Encounter (Signed)
I sent the results to the pt this morning. It was abnormal, see chart. Recommended neurosurgery evaluation. Asked if they had a preferred provider.

## 2015-05-15 NOTE — Telephone Encounter (Signed)
OK. I put in a referral order. However, if he is having 10/10 after starting Prednisone taper this weekend, he should be seen in the ER for evaluation today. It often takes several weeks to get pt in with neurosurgery.

## 2015-05-15 NOTE — Telephone Encounter (Signed)
Spoke with patient, his pain is not a 10/10, it is manageable today.  He understands that he needs to wait for the referral, but wants to know if there are any things he can do currently while waiting?  I.E. Ice or heat on his back, ROM exercises.  He doesn't want to hurt it more, but wants some type of relief.  Thanks

## 2015-05-15 NOTE — Telephone Encounter (Signed)
Ice to back. Complete Prednisone taper. Hydrocodone for pain as prescribed. Pain should gradually improve. I would not recommend exercises until evaluation with neurosurgery, given severity of symptoms.

## 2015-05-16 NOTE — Telephone Encounter (Signed)
Notified pt of Dr. Derry Skill comments, pt verbalized understanding

## 2015-05-16 NOTE — Telephone Encounter (Signed)
Called patient and discussed process for call backs and delivery of messages.  Patient advised understanding.  I advised appreciation of the feedback to make our processes better and to better educate the staff when patient requests specifically a telephone call back and not only a My Chart message.  Patient wants to maintain My Chart account.  Apologized the communication for Dr. Durene Cal appointment was incorrect.  Patient was satisfied all items were addressed.

## 2015-05-18 ENCOUNTER — Telehealth: Payer: Self-pay | Admitting: Internal Medicine

## 2015-05-18 ENCOUNTER — Other Ambulatory Visit: Payer: BLUE CROSS/BLUE SHIELD

## 2015-05-18 NOTE — Telephone Encounter (Signed)
Called pt to check in. Back pain has improved. Using a back brace to help with pain. He is back at work. He is not using any pain medication, however he did finish the Prednisone. Trying to limit activity. Has appointment with [facility] on 2/21.

## 2015-05-31 ENCOUNTER — Ambulatory Visit: Payer: BLUE CROSS/BLUE SHIELD

## 2015-06-28 ENCOUNTER — Encounter: Payer: Self-pay | Admitting: Internal Medicine

## 2015-06-28 ENCOUNTER — Ambulatory Visit (INDEPENDENT_AMBULATORY_CARE_PROVIDER_SITE_OTHER): Payer: BLUE CROSS/BLUE SHIELD | Admitting: Internal Medicine

## 2015-06-28 VITALS — BP 139/93 | HR 58 | Temp 97.8°F | Ht 73.0 in | Wt 272.5 lb

## 2015-06-28 DIAGNOSIS — M5432 Sciatica, left side: Secondary | ICD-10-CM | POA: Diagnosis not present

## 2015-06-28 DIAGNOSIS — I10 Essential (primary) hypertension: Secondary | ICD-10-CM

## 2015-06-28 DIAGNOSIS — E785 Hyperlipidemia, unspecified: Secondary | ICD-10-CM

## 2015-06-28 NOTE — Progress Notes (Signed)
Pre visit review using our clinic review tool, if applicable. No additional management support is needed unless otherwise documented below in the visit note. 

## 2015-06-28 NOTE — Assessment & Plan Note (Signed)
Symptoms improved. Evaluated by [facility]. No surgery recommended at this time. Encouraged core strengthening exercise. Follow up prn.

## 2015-06-28 NOTE — Assessment & Plan Note (Signed)
BP Readings from Last 3 Encounters:  06/28/15 139/93  05/11/15 117/80  12/28/14 139/87   BP generally well controlled. Will check renal function with labs. Continue current medications.

## 2015-06-28 NOTE — Patient Instructions (Addendum)
Please return for labs at your convenience.  Follow up in 6 months.

## 2015-06-28 NOTE — Progress Notes (Signed)
Subjective:    Patient ID: Joseph Dalton, male    DOB: [birth year], 55 y.o.   MRN: [id number]  HPI  55YO male presents for follow up.  Last seen in 05/2015 for left sciatic nerve pain. He was seen by [facility] on 2/21. No intervention was recommended. Follow up prn. No current back pain.  HTN - Compliant with medication. No CP, HA.  Wt Readings from Last 3 Encounters:  06/28/15 272 lb 8 oz (123.605 kg)  05/11/15 272 lb 6 oz (123.548 kg)  12/28/14 275 lb (124.739 kg)   BP Readings from Last 3 Encounters:  06/28/15 139/93  05/11/15 117/80  12/28/14 139/87    Past Medical History  Diagnosis Date  . Hyperlipemia   . HTN (hypertension)   . Venous insufficiency    Family History  Problem Relation Age of Onset  . Stroke Mother   . Hyperlipidemia Mother   . Heart disease Father 50    heart attack   No past surgical history on file. Social History   Social History  . Marital Status: Married    Spouse Name: N/A  . Number of Children: 1  . Years of Education: N/A   Occupational History  . Self Employed - Teaching laboratory technician    Social History Main Topics  . Smoking status: Never Smoker   . Smokeless tobacco: Current User    Types: Chew  . Alcohol Use: Yes     Comment: Occasional  . Drug Use: No  . Sexual Activity: Not Asked   Other Topics Concern  . None   Social History Narrative   Lives in [place] with wife, 1 daughter. Dog.  Works as Psychiatric nurse.       Regular Exercise -  NO   Daily Caffeine Use:  1 cup coffee and 1 soda a day    Review of Systems  Constitutional: Negative for fever, chills, activity change, appetite change, fatigue and unexpected weight change.  Eyes: Negative for visual disturbance.  Respiratory: Negative for cough and shortness of breath.   Cardiovascular: Negative for chest pain, palpitations and leg swelling.  Gastrointestinal: Negative for nausea, vomiting, abdominal pain, diarrhea, constipation and abdominal distention.    Genitourinary: Negative for dysuria, urgency and difficulty urinating.  Musculoskeletal: Negative for myalgias, back pain, arthralgias and gait problem.  Skin: Negative for color change and rash.  Hematological: Negative for adenopathy.  Psychiatric/Behavioral: Negative for sleep disturbance and dysphoric mood. The patient is not nervous/anxious.        Objective:    BP 139/93 mmHg  Pulse 58  Temp(Src) 97.8 F (36.6 C) (Oral)  Ht 6\' 1"  (1.854 m)  Wt 272 lb 8 oz (123.605 kg)  BMI 35.96 kg/m2  SpO2 96% Physical Exam  Constitutional: He is oriented to person, place, and time. He appears well-developed and well-nourished. No distress.  HENT:  Head: Normocephalic and atraumatic.  Right Ear: External ear normal.  Left Ear: External ear normal.  Nose: Nose normal.  Mouth/Throat: Oropharynx is clear and moist. No oropharyngeal exudate.  Eyes: Conjunctivae and EOM are normal. Pupils are equal, round, and reactive to light. Right eye exhibits no discharge. Left eye exhibits no discharge. No scleral icterus.  Neck: Normal range of motion. Neck supple. No tracheal deviation present. No thyromegaly present.  Cardiovascular: Normal rate, regular rhythm and normal heart sounds.  Exam reveals no gallop and no friction rub.   No murmur heard. Pulmonary/Chest: Effort normal and breath sounds normal. No accessory muscle usage.  No tachypnea. No respiratory distress. He has no decreased breath sounds. He has no wheezes. He has no rhonchi. He has no rales. He exhibits no tenderness.  Musculoskeletal: Normal range of motion. He exhibits no edema.  Lymphadenopathy:    He has no cervical adenopathy.  Neurological: He is alert and oriented to person, place, and time. No cranial nerve deficit. Coordination normal.  Skin: Skin is warm and dry. No rash noted. He is not diaphoretic. No erythema. No pallor.  Psychiatric: He has a normal mood and affect. His behavior is normal. Judgment and thought content  normal.          Assessment & Plan:   Problem List Items Addressed This Visit      Unprioritized   Hyperlipidemia    Will check LFTs with labs. Continue Atorvastatin.      Hypertension    BP Readings from Last 3 Encounters:  06/28/15 139/93  05/11/15 117/80  12/28/14 139/87   BP generally well controlled. Will check renal function with labs. Continue current medications.      Relevant Orders   Comprehensive metabolic panel   Left sciatic nerve pain - Primary    Symptoms improved. Evaluated by [facility]. No surgery recommended at this time. Encouraged core strengthening exercise. Follow up prn.          Return in about 6 months (around 12/29/2015) for Physical.  Ronette Deter, MD Internal Medicine [facility]

## 2015-06-28 NOTE — Assessment & Plan Note (Signed)
Will check LFTs with labs. Continue Atorvastatin. 

## 2015-10-05 ENCOUNTER — Ambulatory Visit (INDEPENDENT_AMBULATORY_CARE_PROVIDER_SITE_OTHER): Payer: BLUE CROSS/BLUE SHIELD | Admitting: Internal Medicine

## 2015-10-05 ENCOUNTER — Encounter: Payer: Self-pay | Admitting: Internal Medicine

## 2015-10-05 ENCOUNTER — Telehealth: Payer: Self-pay | Admitting: Internal Medicine

## 2015-10-05 VITALS — BP 150/98 | HR 69 | Ht 74.0 in | Wt 279.4 lb

## 2015-10-05 DIAGNOSIS — Z125 Encounter for screening for malignant neoplasm of prostate: Secondary | ICD-10-CM

## 2015-10-05 DIAGNOSIS — L739 Follicular disorder, unspecified: Secondary | ICD-10-CM | POA: Diagnosis not present

## 2015-10-05 DIAGNOSIS — I10 Essential (primary) hypertension: Secondary | ICD-10-CM

## 2015-10-05 DIAGNOSIS — R221 Localized swelling, mass and lump, neck: Secondary | ICD-10-CM

## 2015-10-05 DIAGNOSIS — Z1159 Encounter for screening for other viral diseases: Secondary | ICD-10-CM

## 2015-10-05 LAB — LIPID PANEL
Cholesterol: 188 mg/dL (ref 0–200)
HDL: 38.2 mg/dL — ABNORMAL LOW (ref >39.00)
LDL Cholesterol: 117 mg/dL — ABNORMAL HIGH (ref 0–99)
NonHDL: 149.37
Total CHOL/HDL Ratio: 5
Triglycerides: 164 mg/dL — ABNORMAL HIGH (ref 0.0–149.0)
VLDL: 32.8 mg/dL (ref 0.0–40.0)

## 2015-10-05 LAB — CBC WITH DIFFERENTIAL/PLATELET
Basophils Absolute: 0 10*3/uL (ref 0.0–0.1)
Basophils Relative: 0.6 % (ref 0.0–3.0)
Eosinophils Absolute: 0.3 10*3/uL (ref 0.0–0.7)
Eosinophils Relative: 4.2 % (ref 0.0–5.0)
HCT: 46.4 % (ref 39.0–52.0)
Hemoglobin: 15.9 g/dL (ref 13.0–17.0)
Lymphocytes Relative: 27.7 % (ref 12.0–46.0)
Lymphs Abs: 1.8 10*3/uL (ref 0.7–4.0)
MCHC: 34.3 g/dL (ref 30.0–36.0)
MCV: 86.6 fl (ref 78.0–100.0)
Monocytes Absolute: 0.6 10*3/uL (ref 0.1–1.0)
Monocytes Relative: 9.3 % (ref 3.0–12.0)
Neutro Abs: 3.9 10*3/uL (ref 1.4–7.7)
Neutrophils Relative %: 58.2 % (ref 43.0–77.0)
Platelets: 191 10*3/uL (ref 150.0–400.0)
RBC: 5.36 Mil/uL (ref 4.22–5.81)
RDW: 12.7 % (ref 11.5–15.5)
WBC: 6.6 10*3/uL (ref 4.0–10.5)

## 2015-10-05 LAB — COMPREHENSIVE METABOLIC PANEL
ALT: 48 U/L (ref 0–53)
AST: 25 U/L (ref 0–37)
Albumin: 4.1 g/dL (ref 3.5–5.2)
Alkaline Phosphatase: 101 U/L (ref 39–117)
BUN: 20 mg/dL (ref 6–23)
CO2: 30 mEq/L (ref 19–32)
Calcium: 9.1 mg/dL (ref 8.4–10.5)
Chloride: 103 mEq/L (ref 96–112)
Creatinine, Ser: 0.84 mg/dL (ref 0.40–1.50)
GFR: 101.34 mL/min (ref >60.00)
Glucose, Bld: 100 mg/dL — ABNORMAL HIGH (ref 70–99)
Potassium: 3.7 mEq/L (ref 3.5–5.1)
Sodium: 139 mEq/L (ref 135–145)
Total Bilirubin: 0.7 mg/dL (ref 0.2–1.2)
Total Protein: 6.7 g/dL (ref 6.0–8.3)

## 2015-10-05 LAB — MICROALBUMIN / CREATININE URINE RATIO
Creatinine,U: 97.1 mg/dL
Microalb Creat Ratio: 0.8 mg/g (ref 0.0–30.0)
Microalb, Ur: 0.8 mg/dL (ref 0.0–1.9)

## 2015-10-05 MED ORDER — GENTAMICIN SULFATE 0.1 % EX OINT: 1.0000 "application " | TOPICAL_OINTMENT | Freq: Three times a day (TID) | CUTANEOUS | Status: DC

## 2015-10-05 NOTE — Assessment & Plan Note (Signed)
Mild folliculitis over anterior abdomen. Will start topical gentamicin bid-tid. Follow up recheck in 4 weeks and prn.

## 2015-10-05 NOTE — Telephone Encounter (Signed)
Noted  

## 2015-10-05 NOTE — Progress Notes (Signed)
Subjective:    Patient ID: Joseph Dalton, male    DOB: May 26, 1960, 55 y.o.   MRN: [id number]  HPI  55YO male presents for acute visit.  Lump on neck - Present for a few months. Not painful. Not red. Slightly bigger but unsure. No trauma to neck.  Skin lesion on abdomen - present for a few months. Red. Not draining. Tried some OTC cream with no improvement. Irritated by belt.  HTN - Reports compliance with medication. No CP, HA, palpitations.   Wt Readings from Last 3 Encounters:  10/05/15 279 lb 6.4 oz (126.735 kg)  06/28/15 272 lb 8 oz (123.605 kg)  05/11/15 272 lb 6 oz (123.548 kg)   BP Readings from Last 3 Encounters:  10/05/15 150/98  06/28/15 139/93  05/11/15 117/80    Past Medical History  Diagnosis Date  . Hyperlipemia   . HTN (hypertension)   . Venous insufficiency    Family History  Problem Relation Age of Onset  . Stroke Mother   . Hyperlipidemia Mother   . Heart disease Father 67    heart attack   No past surgical history on file. Social History   Social History  . Marital Status: Married    Spouse Name: N/A  . Number of Children: 1  . Years of Education: N/A   Occupational History  . Self Employed - Teaching laboratory technician    Social History Main Topics  . Smoking status: Never Smoker   . Smokeless tobacco: Current User    Types: Chew  . Alcohol Use: Yes     Comment: Occasional  . Drug Use: No  . Sexual Activity: Not Asked   Other Topics Concern  . None   Social History Narrative   Lives in [place] with wife, 1 daughter. Dog.  Works as Psychiatric nurse.       Regular Exercise -  NO   Daily Caffeine Use:  1 cup coffee and 1 soda a day    Review of Systems  Constitutional: Negative for fever, chills, activity change, appetite change, fatigue and unexpected weight change.  Eyes: Negative for visual disturbance.  Respiratory: Negative for cough and shortness of breath.   Cardiovascular: Negative for chest pain, palpitations and leg  swelling.  Gastrointestinal: Negative for nausea, vomiting, abdominal pain, diarrhea, constipation and abdominal distention.  Genitourinary: Negative for dysuria, urgency and difficulty urinating.  Musculoskeletal: Negative for arthralgias and gait problem.  Skin: Positive for color change and rash.  Neurological: Negative for headaches.  Hematological: Negative for adenopathy.  Psychiatric/Behavioral: Negative for sleep disturbance and dysphoric mood. The patient is not nervous/anxious.        Objective:    BP 150/98 mmHg  Pulse 69  Ht 6\' 2"  (1.88 m)  Wt 279 lb 6.4 oz (126.735 kg)  BMI 35.86 kg/m2  SpO2 97% Physical Exam  Constitutional: He is oriented to person, place, and time. He appears well-developed and well-nourished. No distress.  HENT:  Head: Normocephalic and atraumatic.  Right Ear: External ear normal.  Left Ear: External ear normal.  Nose: Nose normal.  Mouth/Throat: Oropharynx is clear and moist. No oropharyngeal exudate.  Eyes: Conjunctivae and EOM are normal. Pupils are equal, round, and reactive to light. Right eye exhibits no discharge. Left eye exhibits no discharge. No scleral icterus.  Neck: Normal range of motion. Neck supple. No tracheal deviation present. No thyromegaly present.  Cardiovascular: Normal rate, regular rhythm and normal heart sounds.  Exam reveals no gallop and no friction  rub.   No murmur heard. Pulmonary/Chest: Effort normal and breath sounds normal. No tachypnea. He exhibits no tenderness.  Musculoskeletal: Normal range of motion. He exhibits no edema.  Lymphadenopathy:    He has no cervical adenopathy.  Neurological: He is alert and oriented to person, place, and time. No cranial nerve deficit. Coordination normal.  Skin: Skin is warm and dry. Rash noted. He is not diaphoretic. There is erythema. No pallor.     Psychiatric: He has a normal mood and affect. His behavior is normal. Judgment and thought content normal.            Assessment & Plan:   Problem List Items Addressed This Visit      Unprioritized   Folliculitis    Mild folliculitis over anterior abdomen. Will start topical gentamicin bid-tid. Follow up recheck in 4 weeks and prn.      Relevant Medications   gentamicin ointment (GARAMYCIN) 0.1 %   Hypertension    BP Readings from Last 3 Encounters:  10/05/15 150/98  06/28/15 139/93  05/11/15 117/80   BP elevated. Will check renal function with labs. Continue Amlodipine-benazepril and HCTZ. Follow up in 4 weeks.      Relevant Orders   CBC with Differential/Platelet   Comprehensive metabolic panel   Lipid panel   Microalbumin / creatinine urine ratio   Neck nodule - Primary    Posterior neck nodule. Most consistent with lipoma or sebaceous cyst. Enlarged lymph node also in differential, however nodule appears very superficial for LAD. Will get [place] for evaluation.      Relevant Orders   US Soft Tissue Head/Neck   Screening for prostate cancer    Will check PSA with labs. He understands limitations of PSA testing.      Relevant Orders   PSA, total and free    Other Visit Diagnoses    Need for hepatitis C screening test        Relevant Orders    Hepatitis C antibody        Return in about 4 weeks (around 11/02/2015) for Recheck.  Ronette Deter, MD Internal Medicine [facility]

## 2015-10-05 NOTE — Patient Instructions (Addendum)
We will set up an ultrasound of your neck.  Start Gentamicin to skin lesion on abdomen 2-3 times daily.  Follow up 4 weeks.

## 2015-10-05 NOTE — Assessment & Plan Note (Addendum)
Will check PSA with labs. He understands limitations of PSA testing.

## 2015-10-05 NOTE — Assessment & Plan Note (Signed)
Posterior neck nodule. Most consistent with lipoma or sebaceous cyst. Enlarged lymph node also in differential, however nodule appears very superficial for LAD. Will get [place] for evaluation.

## 2015-10-05 NOTE — Telephone Encounter (Signed)
[facility] called about the medication of gentamicin ointment (GARAMYCIN) 0.1 % that was ordered they dont have it but it can be ordered for tomorrow. Or they can give pt erythromycin? Please advise.  Call [facility], [place]  Thank you!

## 2015-10-05 NOTE — Telephone Encounter (Signed)
Pharmacy called, please disregard the previous message.

## 2015-10-05 NOTE — Assessment & Plan Note (Signed)
BP Readings from Last 3 Encounters:  10/05/15 150/98  06/28/15 139/93  05/11/15 117/80   BP elevated. Will check renal function with labs. Continue Amlodipine-benazepril and HCTZ. Follow up in 4 weeks.

## 2015-10-05 NOTE — Progress Notes (Signed)
Pre visit review using our clinic review tool, if applicable. No additional management support is needed unless otherwise documented below in the visit note. 

## 2015-10-06 LAB — PSA, TOTAL AND FREE
PSA, Free Pct: 30 % (ref >25)
PSA, Free: 0.29 ng/mL
PSA: 0.98 ng/mL (ref <=4.00)

## 2015-10-06 LAB — HEPATITIS C ANTIBODY: HCV Ab: NEGATIVE

## 2015-10-09 ENCOUNTER — Other Ambulatory Visit: Payer: BLUE CROSS/BLUE SHIELD

## 2015-10-09 ENCOUNTER — Ambulatory Visit: Payer: BLUE CROSS/BLUE SHIELD

## 2015-10-17 ENCOUNTER — Ambulatory Visit
Admission: RE | Admit: 2015-10-17 | Discharge: 2015-10-17 | Disposition: A | Discharge status: Home / Self Care | Payer: BLUE CROSS/BLUE SHIELD | Source: Ambulatory Visit | Attending: Internal Medicine | Admitting: Internal Medicine

## 2015-10-17 DIAGNOSIS — R221 Localized swelling, mass and lump, neck: Secondary | ICD-10-CM | POA: Diagnosis not present

## 2015-11-02 ENCOUNTER — Ambulatory Visit (INDEPENDENT_AMBULATORY_CARE_PROVIDER_SITE_OTHER): Payer: BLUE CROSS/BLUE SHIELD | Admitting: Internal Medicine

## 2015-11-02 ENCOUNTER — Encounter: Payer: Self-pay | Admitting: Internal Medicine

## 2015-11-02 VITALS — BP 126/80 | HR 64 | Wt 277.6 lb

## 2015-11-02 DIAGNOSIS — L739 Follicular disorder, unspecified: Secondary | ICD-10-CM

## 2015-11-02 DIAGNOSIS — R221 Localized swelling, mass and lump, neck: Secondary | ICD-10-CM

## 2015-11-02 DIAGNOSIS — I10 Essential (primary) hypertension: Secondary | ICD-10-CM | POA: Diagnosis not present

## 2015-11-02 NOTE — Patient Instructions (Signed)
Follow up for physical in September

## 2015-11-02 NOTE — Assessment & Plan Note (Signed)
Folliculitis over abdomen. No change with topical Gentamicin. Will set up evaluation with dermatology.

## 2015-11-02 NOTE — Assessment & Plan Note (Signed)
BP Readings from Last 3 Encounters:  11/02/15 126/80  10/05/15 (!) 150/98  06/28/15 (!) 139/93   BP well controlled. Continue current medication.

## 2015-11-02 NOTE — Progress Notes (Signed)
Pre visit review using our clinic review tool, if applicable. No additional management support is needed unless otherwise documented below in the visit note. 

## 2015-11-02 NOTE — Progress Notes (Signed)
Subjective:    Patient ID: Joseph Dalton, male    DOB: [birth year], 55 y.o.   MRN: [id number]  HPI  55YO male presents for follow up.  Recently seen for HTN and neck nodule. [place] of neck nodule was most consistent with lipoma.  HTN - Compliant with medication. No CP, HA.  Neck nodule - No changes noted. Not painful.  No improvement in rash over abdomen with use of topical Gentamicin.  Wt Readings from Last 3 Encounters:  11/02/15 277 lb 9.6 oz (125.9 kg)  10/05/15 279 lb 6.4 oz (126.7 kg)  06/28/15 272 lb 8 oz (123.6 kg)   BP Readings from Last 3 Encounters:  11/02/15 126/80  10/05/15 (!) 150/98  06/28/15 (!) 139/93    Past Medical History:  Diagnosis Date  . HTN (hypertension)   . Hyperlipemia   . Venous insufficiency    Family History  Problem Relation Age of Onset  . Stroke Mother   . Hyperlipidemia Mother   . Heart disease Father 51    heart attack   No past surgical history on file. Social History   Social History  . Marital status: Married    Spouse name: N/A  . Number of children: 1  . Years of education: N/A   Occupational History  . Self Employed - Teaching laboratory technician    Social History Main Topics  . Smoking status: Never Smoker  . Smokeless tobacco: Current User    Types: Chew  . Alcohol use No     Comment: Occasional  . Drug use: No  . Sexual activity: Not Asked   Other Topics Concern  . None   Social History Narrative   Lives in [place] with wife, 1 daughter. Dog.  Works as Psychiatric nurse.       Regular Exercise -  NO   Daily Caffeine Use:  1 cup coffee and 1 soda a day    Review of Systems  Constitutional: Negative for activity change, appetite change, chills, fatigue, fever and unexpected weight change.  Eyes: Negative for visual disturbance.  Respiratory: Negative for cough, chest tightness and shortness of breath.   Cardiovascular: Negative for chest pain, palpitations and leg swelling.  Gastrointestinal: Negative for  abdominal distention, abdominal pain, constipation, diarrhea and nausea.  Genitourinary: Negative for difficulty urinating, dysuria and urgency.  Musculoskeletal: Negative for arthralgias and gait problem.  Skin: Positive for color change and rash.  Hematological: Negative for adenopathy.  Psychiatric/Behavioral: Negative for dysphoric mood and sleep disturbance. The patient is not nervous/anxious.        Objective:    BP 126/80 (BP Location: Left Arm, Patient Position: Sitting, Cuff Size: Large)   Pulse 64   Wt 277 lb 9.6 oz (125.9 kg)   SpO2 97%   BMI 35.64 kg/m  Physical Exam  Constitutional: He is oriented to person, place, and time. He appears well-developed and well-nourished. No distress.  HENT:  Head: Normocephalic and atraumatic.  Right Ear: External ear normal.  Left Ear: External ear normal.  Nose: Nose normal.  Mouth/Throat: Oropharynx is clear and moist.  Eyes: Conjunctivae and EOM are normal. Pupils are equal, round, and reactive to light. Right eye exhibits no discharge. Left eye exhibits no discharge. No scleral icterus.  Neck: Normal range of motion. Neck supple. No tracheal deviation present. No thyromegaly present.  Cardiovascular: Normal rate, regular rhythm and normal heart sounds.  Exam reveals no gallop and no friction rub.   No murmur heard. Pulmonary/Chest: Effort normal  and breath sounds normal. No accessory muscle usage. No tachypnea. No respiratory distress. He has no decreased breath sounds. He has no wheezes. He has no rhonchi. He has no rales. He exhibits no tenderness.  Musculoskeletal: Normal range of motion. He exhibits no edema.  Lymphadenopathy:    He has no cervical adenopathy.  Neurological: He is alert and oriented to person, place, and time. No cranial nerve deficit. Coordination normal.  Skin: Skin is warm and dry. Rash noted. Rash is pustular. He is not diaphoretic. No erythema. No pallor.     Psychiatric: He has a normal mood and affect.  His behavior is normal. Judgment and thought content normal.          Assessment & Plan:   Problem List Items Addressed This Visit      Unprioritized   Folliculitis    Folliculitis over abdomen. No change with topical Gentamicin. Will set up evaluation with dermatology.      Relevant Orders   Ambulatory referral to Dermatology   Hypertension - Primary    BP Readings from Last 3 Encounters:  11/02/15 126/80  10/05/15 (!) 150/98  06/28/15 (!) 139/93   BP well controlled. Continue current medication.      Neck nodule    Neck nodule most c/w lipoma. Will continue to monitor. Discussed referral for excision and/or additional imaging if any changes.       Other Visit Diagnoses   None.      Return in about 4 weeks (around 11/30/2015) for New Patient.  Ronette Deter, MD Internal Medicine [facility]

## 2015-11-02 NOTE — Assessment & Plan Note (Signed)
Neck nodule most c/w lipoma. Will continue to monitor. Discussed referral for excision and/or additional imaging if any changes.

## 2015-12-07 ENCOUNTER — Encounter: Payer: Self-pay | Admitting: Internal Medicine

## 2015-12-07 ENCOUNTER — Ambulatory Visit (INDEPENDENT_AMBULATORY_CARE_PROVIDER_SITE_OTHER): Payer: BLUE CROSS/BLUE SHIELD | Admitting: Internal Medicine

## 2015-12-07 ENCOUNTER — Ambulatory Visit: Payer: BLUE CROSS/BLUE SHIELD | Admitting: Internal Medicine

## 2015-12-07 VITALS — BP 130/92 | HR 72 | Temp 98.1°F | Resp 14 | Ht 74.0 in | Wt 274.5 lb

## 2015-12-07 DIAGNOSIS — E785 Hyperlipidemia, unspecified: Secondary | ICD-10-CM

## 2015-12-07 DIAGNOSIS — S46311A Strain of muscle, fascia and tendon of triceps, right arm, initial encounter: Secondary | ICD-10-CM

## 2015-12-07 DIAGNOSIS — I10 Essential (primary) hypertension: Secondary | ICD-10-CM | POA: Diagnosis not present

## 2015-12-07 DIAGNOSIS — E669 Obesity, unspecified: Secondary | ICD-10-CM

## 2015-12-07 NOTE — Progress Notes (Signed)
Subjective:  Patient ID: Joseph Dalton, male    DOB: 20-Jul-1960  Age: 55 y.o. MRN: [id number]  CC: The primary encounter diagnosis was Triceps strain, right, initial encounter. Diagnoses of Obesity (BMI 30-39.9), Essential hypertension, and Hyperlipidemia were also pertinent to this visit.  HPI Joseph Dalton presents for follow up on acute and chronic conditions.    1) Right arm pain for the last couple of weeks.  Pain is located in the upper arm triceps /deltoid area.  Denies shoulder and elbow joint pain.  No recent fllas.  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  2) follow up on hypertension and hyperlipidemia.  Medications tolerated,  Has been on same regimen for years  3) Obesity:  Frustrated at inability to lose weight.  Tries to eat healthy , but can't lose weight.  Tried Herbal Life diet and vitamin .  Got tired of the shakes.  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 most recent personal best 256  Lbs.   4) Had some back trouble a few months ago .  Saw Jovita Gamma for evaluation of left sided sciatica in Feb .. Uses NSAID and MR in the past   5) Influenza vaccine refused     Outpatient Medications Prior to Visit  Medication Sig Dispense Refill  . amLODipine-benazepril (LOTREL) 5-20 MG capsule Take 1 capsule by mouth daily. 90 capsule 3  . atorvastatin (LIPITOR) 20 MG tablet Take 1 tablet (20 mg total) by mouth daily. 90 tablet 3  . gentamicin ointment (GARAMYCIN) 0.1 % Apply 1 application topically 3 (three) times daily. 30 g 1  . hydrochlorothiazide (HYDRODIURIL) 25 MG tablet Take 1 tablet (25 mg total) by mouth daily. 90 tablet 3  . Multiple Vitamins-Minerals (MULTIVITAMIN WITH MINERALS) tablet Take 1 tablet by mouth daily.     No facility-administered medications prior to visit.     Review of Systems;  Patient denies headache, fevers, malaise, unintentional weight loss, skin rash, eye pain,  sinus congestion and sinus pain, sore throat, dysphagia,  hemoptysis , cough, dyspnea, wheezing, chest pain, palpitations, orthopnea, edema, abdominal pain, nausea, melena, diarrhea, constipation, flank pain, dysuria, hematuria, urinary  Frequency, nocturia, numbness, tingling, seizures,  Focal weakness, Loss of consciousness,  Tremor, insomnia, depression, anxiety, and suicidal ideation.      Objective:  BP (!) 130/92   Pulse 72   Temp 98.1 F (36.7 C) (Oral)   Resp 14   Ht 6\' 2"  (1.88 m)   Wt 274 lb 8 oz (124.5 kg)   SpO2 96%   BMI 35.24 kg/m   BP Readings from Last 3 Encounters:  12/07/15 (!) 130/92  11/02/15 126/80  10/05/15 (!) 150/98    Wt Readings from Last 3 Encounters:  12/07/15 274 lb 8 oz (124.5 kg)  11/02/15 277 lb 9.6 oz (125.9 kg)  10/05/15 279 lb 6.4 oz (126.7 kg)    General appearance: alert, cooperative and appears stated age Ears: normal TM's and external ear canals both ears Throat: lips, mucosa, and tongue normal; teeth and gums normal Neck: no adenopathy, no carotid bruit, supple, symmetrical, trachea midline and thyroid not enlarged, symmetric, no tenderness/mass/nodules Back: symmetric, no curvature. ROM normal. No CVA tenderness. Lungs: clear to auscultation bilaterally Heart: regular rate and rhythm, S1, S2 normal, no murmur, click, rub or gallop Abdomen: soft, non-tender; bowel sounds normal; no masses,  no organomegaly Pulses: 2+ and symmetric  Skin: Skin color, texture, turgor normal. No rashes or lesions Lymph nodes: Cervical, supraclavicular, and axillary nodes normal.  No results found for: HGBA1C  Lab Results  Component Value Date   CREATININE 0.84 10/05/2015   CREATININE 0.79 12/28/2014   CREATININE 0.85 07/08/2014    Lab Results  Component Value Date   WBC 6.6 10/05/2015   HGB 15.9 10/05/2015   HCT 46.4 10/05/2015   PLT 191.0 10/05/2015   GLUCOSE 100 (H) 10/05/2015   CHOL 188 10/05/2015   TRIG 164.0 (H) 10/05/2015   HDL 38.20  (L) 10/05/2015   LDLCALC 117 (H) 10/05/2015   ALT 48 10/05/2015   AST 25 10/05/2015   NA 139 10/05/2015   K 3.7 10/05/2015   CL 103 10/05/2015   CREATININE 0.84 10/05/2015   BUN 20 10/05/2015   CO2 30 10/05/2015   TSH 0.75 11/22/2013   PSA 0.98 10/05/2015   MICROALBUR 0.8 10/05/2015    US Soft Tissue Head/neck  Result Date: 10/17/2015 CLINICAL DATA:  Palpable soft tissue nodule of the posterior midline neck for approximately 6 months. EXAM: ULTRASOUND OF HEAD/NECK SOFT TISSUES TECHNIQUE: Ultrasound examination of the head and neck soft tissues was performed in the area of clinical concern. COMPARISON:  None. FINDINGS: The palpable abnormality corresponds to an ovoid soft tissue nodule in the subcutaneous fat measuring roughly 2.6 x 0.6 x 1.6 cm. This is relatively hypoechoic and there is a suggestion of central echogenicity. No definite significant blood flow is seen within the soft tissue. Differential considerations by ultrasound include lipoma and lymph node. IMPRESSION: Palpable abnormality corresponds to ovoid subcutaneous soft tissue in the posterior neck. Appearance is nonspecific but this may represent a lipoma. Lymph node is felt less likely given appearance. If the palpable abnormality is enlarging clinically, recommend correlation with CT of the neck with contrast. Electronically Signed   By: Aletta Edouard M.D.   On: 10/17/2015 09:57    Assessment & Plan:   Problem List Items Addressed This Visit    Hyperlipidemia    LDL and triglycerides are at goal on current medications. He has no side effects and liver enzymes are normal. No changes today  Lab Results  Component Value Date   CHOL 188 10/05/2015   HDL 38.20 (L) 10/05/2015   LDLCALC 117 (H) 10/05/2015   TRIG 164.0 (H) 10/05/2015   CHOLHDL 5 10/05/2015   Lab Results  Component Value Date   ALT 48 10/05/2015   AST 25 10/05/2015   ALKPHOS 101 10/05/2015   BILITOT 0.7 10/05/2015          Hypertension     Elevated today due to white coat hypertension. No changes      Obesity (BMI 30-39.9)    I have addressed  BMI and reviewed his diet and his lack of aerobic exercise as barriers to success.  recommended wt loss of 10% of body weight over the next 6 months using a low fat, low starch, high protein  fruit/vegetable based Mediterranean diet and 30 minutes of aerobic exercise a minimum of 5 days per week.         Other Visit Diagnoses    Triceps strain, right, initial encounter    -  Primary     A total of 25 minutes of face to face time was spent with patient more than half of which was spent in counselling about the above mentioned conditions  and coordination of care    I am having Mr. Bankowski maintain his  multivitamin with minerals, atorvastatin, amLODipine-benazepril, hydrochlorothiazide, and gentamicin ointment.  No orders of the defined types were placed in this encounter.   There are no discontinued medications.  Follow-up: Return in about 6 months (around 06/05/2016).   [name], MD

## 2015-12-07 NOTE — Progress Notes (Signed)
Pre-visit discussion using our clinic review tool. No additional management support is needed unless otherwise documented below in the visit note.  

## 2015-12-07 NOTE — Patient Instructions (Addendum)
Try using Aleve twice daily for your triceps muscle strain   Stop if stomach starts to bother you    Your first goal is 256 lbs . You need to add  30 minutes of treadmill  5 days per week (goal)    For breakfast:  D[place] Clap frittatas Premier Protein shakes ([employer]) Kind Protein bars (Low glycemic index)  All eggs fine,  Avoid potateos except once a week    Lunch:  Use the low carb breads to make your sandwhich meat go farther    If Dinner is late,  Keep it high protein low carb.  (grilled or baked meat and veggies)  Try not to eat dinner within 2 hours of going to bed      To make a low carb chip :  Take the Joseph's Lavash or Pita bread,  Or the [place] Low carb whole wheat tortilla   Place on metal cookie sheet  Brush with olive oil  Sprinkle garlic powder (NOT garlic salt), grated parmesan cheese, mediterranean seasoning , or all of them?  Bake at 275 for 30 minutes   We have substitutions for your potatoes!!  Try the mashed cauliflower and riced cauliflower dishes instead of rice and mashed potatoes  Mashed turnips are also very low carb!   For desserts :  Try the Dannon Lt n Fit greek yogurt dessert flavors and top with reddi Whip .  8 carbs,  80 calories  Try Oikos Triple Zero [place] Yogurt in the salted caramel, and the coffee flavors  With Whipped Cream for dessert  breyer's low carb ice cream, available in bars (on a stick, better ) or scoopable ice cream  HERE ARE THE LOW CARB  BREAD CHOICES

## 2015-12-10 NOTE — Assessment & Plan Note (Signed)
LDL and triglycerides are at goal on current medications. He has no side effects and liver enzymes are normal. No changes today  Lab Results  Component Value Date   CHOL 188 10/05/2015   HDL 38.20 (L) 10/05/2015   LDLCALC 117 (H) 10/05/2015   TRIG 164.0 (H) 10/05/2015   CHOLHDL 5 10/05/2015   Lab Results  Component Value Date   ALT 48 10/05/2015   AST 25 10/05/2015   ALKPHOS 101 10/05/2015   BILITOT 0.7 10/05/2015

## 2015-12-10 NOTE — Assessment & Plan Note (Signed)
Elevated today due to white coat hypertension. No changes

## 2015-12-10 NOTE — Assessment & Plan Note (Signed)
I have addressed  BMI and reviewed his diet and his lack of aerobic exercise as barriers to success.  recommended wt loss of 10% of body weight over the next 6 months using a low fat, low starch, high protein  fruit/vegetable based Mediterranean diet and 30 minutes of aerobic exercise a minimum of 5 days per week.

## 2016-03-07 ENCOUNTER — Other Ambulatory Visit: Payer: Self-pay

## 2016-03-07 ENCOUNTER — Encounter: Payer: Self-pay | Admitting: Internal Medicine

## 2016-03-07 DIAGNOSIS — I10 Essential (primary) hypertension: Secondary | ICD-10-CM

## 2016-03-07 MED ORDER — ATORVASTATIN CALCIUM 20 MG PO TABS: 20.0000 mg | ORAL_TABLET | Freq: Every day | ORAL | 3 refills | Status: DC

## 2016-03-07 MED ORDER — AMLODIPINE BESY-BENAZEPRIL HCL 5-20 MG PO CAPS: 1.0000 | ORAL_CAPSULE | Freq: Every day | ORAL | 3 refills | Status: DC

## 2016-03-07 MED ORDER — HYDROCHLOROTHIAZIDE 25 MG PO TABS: 25.0000 mg | ORAL_TABLET | Freq: Every day | ORAL | 3 refills | Status: DC

## 2016-04-22 ENCOUNTER — Ambulatory Visit (INDEPENDENT_AMBULATORY_CARE_PROVIDER_SITE_OTHER): Payer: BLUE CROSS/BLUE SHIELD | Admitting: Family Medicine

## 2016-04-22 ENCOUNTER — Encounter: Payer: Self-pay | Admitting: Family Medicine

## 2016-04-22 VITALS — BP 140/96 | HR 78 | Temp 100.0°F | Resp 16 | Wt 271.4 lb

## 2016-04-22 DIAGNOSIS — J111 Influenza due to unidentified influenza virus with other respiratory manifestations: Secondary | ICD-10-CM

## 2016-04-22 DIAGNOSIS — Z20828 Contact with and (suspected) exposure to other viral communicable diseases: Secondary | ICD-10-CM | POA: Insufficient documentation

## 2016-04-22 LAB — POCT INFLUENZA A/B
Influenza A, POC: NEGATIVE
Influenza B, POC: NEGATIVE

## 2016-04-22 MED ORDER — OSELTAMIVIR PHOSPHATE 75 MG PO CAPS: 75.0000 mg | ORAL_CAPSULE | Freq: Two times a day (BID) | ORAL | 0 refills | Status: DC

## 2016-04-22 NOTE — Progress Notes (Signed)
Pre visit review using our clinic review tool, if applicable. No additional management support is needed unless otherwise documented below in the visit note. 

## 2016-04-22 NOTE — Progress Notes (Signed)
Subjective:  Patient ID: Joseph Dalton, male    DOB: March 26, 1961  Age: 56 y.o. MRN: [id number]  CC: ? Flu  HPI:  56 year old male with hypertension, hyperlipidemia, obesity presents with flulike symptoms.  Patient reports that he's been sick since Saturday. His wife was recently seen by me and was diagnosed with influenza. He's had fever, cough, congestion, body aches, chills, headache. No known exacerbating or relieving factors. Symptoms are severe. No known exacerbating or relieving factors. No other associated symptoms. No other complaints at this time.  Social Hx   Social History   Social History  . Marital status: Married    Spouse name: N/A  . Number of children: 1  . Years of education: N/A   Occupational History  . Self Employed - Teaching laboratory technician    Social History Main Topics  . Smoking status: Never Smoker  . Smokeless tobacco: Current User    Types: Chew  . Alcohol use No     Comment: Occasional  . Drug use: No  . Sexual activity: Not Asked   Other Topics Concern  . None   Social History Narrative   Lives in [place] with wife, 1 daughter. Dog.  Works as Psychiatric nurse.       Regular Exercise -  NO   Daily Caffeine Use:  1 cup coffee and 1 soda a day   Review of Systems  Constitutional: Positive for fever.  HENT: Positive for congestion.   Respiratory: Positive for cough.   Musculoskeletal:       Body aches.  Neurological: Positive for headaches.   Objective:  BP (!) 140/96   Pulse 78   Temp 100 F (37.8 C) (Oral)   Resp 16   Wt 271 lb 6.4 oz (123.1 kg)   SpO2 96%   BMI 34.85 kg/m   BP/Weight 04/22/2016 12/07/2015 [id number]  Systolic BP [id number]  Diastolic BP 96 92 80  Wt. (Lbs) 271.4 274.5 277.6  BMI 34.85 35.24 35.64   Physical Exam  Constitutional: He is oriented to person, place, and time. He appears well-developed. No distress.  HENT:  Head: Normocephalic and atraumatic.  Mouth/Throat: Oropharynx is clear and moist.    Normal TM's.  Neck: Neck supple.  Cardiovascular: Normal rate and regular rhythm.   Pulmonary/Chest: Effort normal and breath sounds normal.  Neurological: He is alert and oriented to person, place, and time.  Psychiatric: He has a normal mood and affect.  Vitals reviewed.  Lab Results  Component Value Date   WBC 6.6 10/05/2015   HGB 15.9 10/05/2015   HCT 46.4 10/05/2015   PLT 191.0 10/05/2015   GLUCOSE 100 (H) 10/05/2015   CHOL 188 10/05/2015   TRIG 164.0 (H) 10/05/2015   HDL 38.20 (L) 10/05/2015   LDLCALC 117 (H) 10/05/2015   ALT 48 10/05/2015   AST 25 10/05/2015   NA 139 10/05/2015   K 3.7 10/05/2015   CL 103 10/05/2015   CREATININE 0.84 10/05/2015   BUN 20 10/05/2015   CO2 30 10/05/2015   TSH 0.75 11/22/2013   PSA 0.98 10/05/2015   MICROALBUR 0.8 10/05/2015   Assessment & Plan:   Problem List Items Addressed This Visit    Influenza - Primary    New problem. Rapid flu was negative. However, given history and close contact with flu, treating with Tamiflu (I suspect false negative test).      Relevant Medications   oseltamivir (TAMIFLU) 75 MG capsule   Other Relevant Orders  POCT Influenza A/B (Completed)      Meds ordered this encounter  Medications  . oseltamivir (TAMIFLU) 75 MG capsule    Sig: Take 1 capsule (75 mg total) by mouth 2 (two) times daily.    Dispense:  10 capsule    Refill:  0    Follow-up: PRN  [place]

## 2016-04-22 NOTE — Patient Instructions (Signed)

## 2016-04-22 NOTE — Assessment & Plan Note (Signed)
New problem. Rapid flu was negative. However, given history and close contact with flu, treating with Tamiflu (I suspect false negative test).

## 2016-05-10 ENCOUNTER — Telehealth: Payer: Self-pay | Admitting: Internal Medicine

## 2016-05-10 MED ORDER — PREDNISONE 10 MG PO TABS: ORAL_TABLET | ORAL | 0 refills | Status: DC

## 2016-05-10 NOTE — Telephone Encounter (Signed)
Prednisone taper sent  to [facility]

## 2016-05-10 NOTE — Telephone Encounter (Signed)
Patient advised of below and verbalized understanding.  

## 2016-05-10 NOTE — Telephone Encounter (Signed)
Pt spouse called and stated that pt had pulled his back out and has an appt on Tuesday with the neurosurgeon, and was advised to acall and ask for a medrol rx. Please call pt to let them know. Please advise, thank you!  Call pt @ [place], [place]

## 2016-05-10 NOTE — Telephone Encounter (Signed)
Reason for call: lower back pain,  Symptoms:happened when he was tying shoes standing up , walking makes pain worse 8/10 no injury  Duration yesterday Medications: Tylenol  Would like a Medrol dose pak called in has appointment with neurosurgeon on Tuesday.  Please advise.

## 2016-06-03 ENCOUNTER — Encounter: Payer: Self-pay | Admitting: Internal Medicine

## 2016-06-03 ENCOUNTER — Ambulatory Visit (INDEPENDENT_AMBULATORY_CARE_PROVIDER_SITE_OTHER): Payer: BLUE CROSS/BLUE SHIELD | Admitting: Internal Medicine

## 2016-06-03 VITALS — BP 128/90 | HR 66 | Resp 16 | Wt 281.0 lb

## 2016-06-03 DIAGNOSIS — E7800 Pure hypercholesterolemia, unspecified: Secondary | ICD-10-CM

## 2016-06-03 DIAGNOSIS — I10 Essential (primary) hypertension: Secondary | ICD-10-CM

## 2016-06-03 DIAGNOSIS — R7301 Impaired fasting glucose: Secondary | ICD-10-CM | POA: Diagnosis not present

## 2016-06-03 DIAGNOSIS — E669 Obesity, unspecified: Secondary | ICD-10-CM | POA: Diagnosis not present

## 2016-06-03 DIAGNOSIS — M539 Dorsopathy, unspecified: Secondary | ICD-10-CM

## 2016-06-03 DIAGNOSIS — M5386 Other specified dorsopathies, lumbar region: Secondary | ICD-10-CM

## 2016-06-03 LAB — COMPREHENSIVE METABOLIC PANEL
ALT: 33 U/L (ref 0–53)
AST: 23 U/L (ref 0–37)
Albumin: 4.1 g/dL (ref 3.5–5.2)
Alkaline Phosphatase: 78 U/L (ref 39–117)
BUN: 17 mg/dL (ref 6–23)
CO2: 32 mEq/L (ref 19–32)
Calcium: 9.3 mg/dL (ref 8.4–10.5)
Chloride: 103 mEq/L (ref 96–112)
Creatinine, Ser: 0.82 mg/dL (ref 0.40–1.50)
GFR: 103.94 mL/min (ref >60.00)
Glucose, Bld: 107 mg/dL — ABNORMAL HIGH (ref 70–99)
Potassium: 4.3 mEq/L (ref 3.5–5.1)
Sodium: 141 mEq/L (ref 135–145)
Total Bilirubin: 0.5 mg/dL (ref 0.2–1.2)
Total Protein: 6.4 g/dL (ref 6.0–8.3)

## 2016-06-03 LAB — LIPID PANEL
Cholesterol: 170 mg/dL (ref 0–200)
HDL: 41.9 mg/dL (ref >39.00)
LDL Cholesterol: 95 mg/dL (ref 0–99)
NonHDL: 128.34
Total CHOL/HDL Ratio: 4
Triglycerides: 168 mg/dL — ABNORMAL HIGH (ref 0.0–149.0)
VLDL: 33.6 mg/dL (ref 0.0–40.0)

## 2016-06-03 LAB — LDL CHOLESTEROL, DIRECT: Direct LDL: 98 mg/dL

## 2016-06-03 LAB — HEMOGLOBIN A1C: Hgb A1c MFr Bld: 5.4 % (ref 4.6–6.5)

## 2016-06-03 MED ORDER — AMLODIPINE BESY-BENAZEPRIL HCL 5-40 MG PO CAPS: 1.0000 | ORAL_CAPSULE | Freq: Every day | ORAL | 1 refills | Status: DC

## 2016-06-03 NOTE — Patient Instructions (Addendum)
I am increasing your blood pressure medication because you are not at goal.  ( goal is 120/70)  You can finish your current bottle.     Get the treadmill set up!  30 minutes daily !    Consider PT session to learn how to strengthen your back !

## 2016-06-03 NOTE — Progress Notes (Signed)
Pre visit review using our clinic review tool, if applicable. No additional management support is needed unless otherwise documented below in the visit note. 

## 2016-06-03 NOTE — Progress Notes (Signed)
Subjective:  Patient ID: Joseph Dalton, male    DOB: 26-Dec-1960  Age: 56 y.o. MRN: [id number]  CC: The primary encounter diagnosis was Impaired fasting glucose. Diagnoses of Essential hypertension, Pure hypercholesterolemia, Obesity (BMI 30-39.9), and Sciatica of left side associated with disorder of lumbar spine were also pertinent to this visit.  HPI RAFEAL Dalton presents for follow up on chronic conditions, and recent influenza illness.   Wife was positive,  both were treated,  Symptoms resolved.    Has gained 7 lb since last visit in August. Due to prednisone   Treated for flare of back pain by nudelman with pred pack 3 weeks ago and MR.  Not taking the lodine prescribed by nudelman  Doesn't need it   No sciatica  But pain occasionally radiates to left groin.    Lab Results  Component Value Date   HGBA1C 5.4 06/03/2016         Outpatient Medications Prior to Visit  Medication Sig Dispense Refill  . amLODipine-benazepril (LOTREL) 5-20 MG capsule Take 1 capsule by mouth daily. 90 capsule 3  . atorvastatin (LIPITOR) 20 MG tablet Take 1 tablet (20 mg total) by mouth daily. 90 tablet 3  . gentamicin ointment (GARAMYCIN) 0.1 % Apply 1 application topically 3 (three) times daily. 30 g 1  . hydrochlorothiazide (HYDRODIURIL) 25 MG tablet Take 1 tablet (25 mg total) by mouth daily. 90 tablet 3  . Multiple Vitamins-Minerals (MULTIVITAMIN WITH MINERALS) tablet Take 1 tablet by mouth daily.    Marland Kitchen oseltamivir (TAMIFLU) 75 MG capsule Take 1 capsule (75 mg total) by mouth 2 (two) times daily. 10 capsule 0  . predniSONE (DELTASONE) 10 MG tablet 6 tablets on Day 1 , then reduce by 1 tablet daily until gone 21 tablet 0   No facility-administered medications prior to visit.     Review of Systems;  Patient denies headache, fevers, malaise, unintentional weight loss, skin rash, eye pain, sinus congestion and sinus pain, sore throat, dysphagia,  hemoptysis , cough, dyspnea, wheezing, chest pain,  palpitations, orthopnea, edema, abdominal pain, nausea, melena, diarrhea, constipation, flank pain, dysuria, hematuria, urinary  Frequency, nocturia, numbness, tingling, seizures,  Focal weakness, Loss of consciousness,  Tremor, insomnia, depression, anxiety, and suicidal ideation.      Objective:  BP 128/90   Pulse 66   Resp 16   Wt 281 lb (127.5 kg)   SpO2 96%   BMI 36.08 kg/m   BP Readings from Last 3 Encounters:  06/03/16 128/90  04/22/16 (!) 140/96  12/07/15 (!) 130/92    Wt Readings from Last 3 Encounters:  06/03/16 281 lb (127.5 kg)  04/22/16 271 lb 6.4 oz (123.1 kg)  12/07/15 274 lb 8 oz (124.5 kg)    General appearance: alert, cooperative and appears stated age Ears: normal TM's and external ear canals both ears Throat: lips, mucosa, and tongue normal; teeth and gums normal Neck: no adenopathy, no carotid bruit, supple, symmetrical, trachea midline and thyroid not enlarged, symmetric, no tenderness/mass/nodules Back: symmetric, no curvature. ROM normal. No CVA tenderness. Lungs: clear to auscultation bilaterally Heart: regular rate and rhythm, S1, S2 normal, no murmur, click, rub or gallop Abdomen: soft, non-tender; bowel sounds normal; no masses,  no organomegaly Pulses: 2+ and symmetric Skin: Skin color, texture, turgor normal. No rashes or lesions Lymph nodes: Cervical, supraclavicular, and axillary nodes normal.  Lab Results  Component Value Date   HGBA1C 5.4 06/03/2016    Lab Results  Component Value Date  CREATININE 0.82 06/03/2016   CREATININE 0.84 10/05/2015   CREATININE 0.79 12/28/2014    Lab Results  Component Value Date   WBC 6.6 10/05/2015   HGB 15.9 10/05/2015   HCT 46.4 10/05/2015   PLT 191.0 10/05/2015   GLUCOSE 107 (H) 06/03/2016   CHOL 170 06/03/2016   TRIG 168.0 (H) 06/03/2016   HDL 41.90 06/03/2016   LDLDIRECT 98.0 06/03/2016   LDLCALC 95 06/03/2016   ALT 33 06/03/2016   AST 23 06/03/2016   NA 141 06/03/2016   K 4.3  06/03/2016   CL 103 06/03/2016   CREATININE 0.82 06/03/2016   BUN 17 06/03/2016   CO2 32 06/03/2016   TSH 0.75 11/22/2013   PSA 0.98 10/05/2015   HGBA1C 5.4 06/03/2016   MICROALBUR 0.8 10/05/2015    US Soft Tissue Head/neck  Result Date: 10/17/2015 CLINICAL DATA:  Palpable soft tissue nodule of the posterior midline neck for approximately 6 months. EXAM: ULTRASOUND OF HEAD/NECK SOFT TISSUES TECHNIQUE: Ultrasound examination of the head and neck soft tissues was performed in the area of clinical concern. COMPARISON:  None. FINDINGS: The palpable abnormality corresponds to an ovoid soft tissue nodule in the subcutaneous fat measuring roughly 2.6 x 0.6 x 1.6 cm. This is relatively hypoechoic and there is a suggestion of central echogenicity. No definite significant blood flow is seen within the soft tissue. Differential considerations by ultrasound include lipoma and lymph node. IMPRESSION: Palpable abnormality corresponds to ovoid subcutaneous soft tissue in the posterior neck. Appearance is nonspecific but this may represent a lipoma. Lymph node is felt less likely given appearance. If the palpable abnormality is enlarging clinically, recommend correlation with CT of the neck with contrast. Electronically Signed   By: Aletta Edouard M.D.   On: 10/17/2015 09:57    Assessment & Plan:   Problem List Items Addressed This Visit    Hyperlipidemia    LDL and triglycerides are at goal on current medications. He has no side effects and liver enzymes are normal. No changes today   Lab Results  Component Value Date   CHOL 170 06/03/2016   HDL 41.90 06/03/2016   LDLCALC 95 06/03/2016   LDLDIRECT 98.0 06/03/2016   TRIG 168.0 (H) 06/03/2016   CHOLHDL 4 06/03/2016   Lab Results  Component Value Date   ALT 33 06/03/2016   AST 23 06/03/2016   ALKPHOS 78 06/03/2016   BILITOT 0.5 06/03/2016         Relevant Medications   amLODipine-benazepril (LOTREL) 5-40 MG capsule   Other Relevant Orders     LDL cholesterol, direct (Completed)   Lipid panel (Completed)   Hypertension    Not at goal,  Increasing Lotrel to 5-40 .  Lab Results  Component Value Date   CREATININE 0.82 06/03/2016   Lab Results  Component Value Date   NA 141 06/03/2016   K 4.3 06/03/2016   CL 103 06/03/2016   CO2 32 06/03/2016         Relevant Medications   amLODipine-benazepril (LOTREL) 5-40 MG capsule   Obesity (BMI 30-39.9)    I have addressed  BMI and recommended wt loss of 10% of body weight over the next 6 months using a low fat, low starch, high protein  fruit/vegetable based Mediterranean diet and 30 minutes of aerobic exercise a minimum of 5 days per week.        Sciatica of left side associated with disorder of lumbar spine    Episodic, Currently resolved,  Recommended PT  Relevant Medications   cyclobenzaprine (FLEXERIL) 10 MG tablet    Other Visit Diagnoses    Impaired fasting glucose    -  Primary   Relevant Orders   Comprehensive metabolic panel (Completed)   Hemoglobin A1c (Completed)      I have discontinued Mr. Wetsel oseltamivir and predniSONE. I am also having him start on amLODipine-benazepril. Additionally, I am having him maintain his multivitamin with minerals, gentamicin ointment, amLODipine-benazepril, atorvastatin, hydrochlorothiazide, cyclobenzaprine, and etodolac.  Meds ordered this encounter  Medications  . cyclobenzaprine (FLEXERIL) 10 MG tablet    Sig: Take 1 tablet by mouth 3 (three) times daily as needed.    Refill:  2  . etodolac (LODINE) 500 MG tablet    Sig: Take 1 tablet by mouth 2 (two) times daily as needed.    Refill:  1  . amLODipine-benazepril (LOTREL) 5-40 MG capsule    Sig: Take 1 capsule by mouth daily.    Dispense:  90 capsule    Refill:  1    Medications Discontinued During This Encounter  Medication Reason  . oseltamivir (TAMIFLU) 75 MG capsule Patient has not taken in last 30 days  . predniSONE (DELTASONE) 10 MG tablet Patient  has not taken in last 30 days  A total of 25 minutes of face to face time was spent with patient more than half of which was spent in counselling about the above mentioned conditions  and coordination of care   Follow-up: Return in about 6 months (around 12/01/2016) for CPE.   [name], MD

## 2016-06-04 NOTE — Assessment & Plan Note (Signed)
I have addressed  BMI and recommended wt loss of 10% of body weight over the next 6 months using a low fat, low starch, high protein  fruit/vegetable based Mediterranean diet and 30 minutes of aerobic exercise a minimum of 5 days per week.   

## 2016-06-04 NOTE — Assessment & Plan Note (Signed)
LDL and triglycerides are at goal on current medications. He has no side effects and liver enzymes are normal. No changes today   Lab Results  Component Value Date   CHOL 170 06/03/2016   HDL 41.90 06/03/2016   LDLCALC 95 06/03/2016   LDLDIRECT 98.0 06/03/2016   TRIG 168.0 (H) 06/03/2016   CHOLHDL 4 06/03/2016   Lab Results  Component Value Date   ALT 33 06/03/2016   AST 23 06/03/2016   ALKPHOS 78 06/03/2016   BILITOT 0.5 06/03/2016

## 2016-06-04 NOTE — Assessment & Plan Note (Addendum)
Episodic, Currently resolved,  Recommended PT

## 2016-06-04 NOTE — Assessment & Plan Note (Signed)
Not at goal,  Increasing Lotrel to 5-40 .  Lab Results  Component Value Date   CREATININE 0.82 06/03/2016   Lab Results  Component Value Date   NA 141 06/03/2016   K 4.3 06/03/2016   CL 103 06/03/2016   CO2 32 06/03/2016

## 2016-06-06 ENCOUNTER — Encounter: Payer: Self-pay | Admitting: Internal Medicine

## 2016-09-14 ENCOUNTER — Other Ambulatory Visit: Payer: Self-pay | Admitting: Internal Medicine

## 2016-12-02 ENCOUNTER — Ambulatory Visit (INDEPENDENT_AMBULATORY_CARE_PROVIDER_SITE_OTHER): Payer: BLUE CROSS/BLUE SHIELD | Admitting: Internal Medicine

## 2016-12-02 ENCOUNTER — Encounter: Payer: Self-pay | Admitting: Internal Medicine

## 2016-12-02 VITALS — BP 126/80 | HR 63 | Temp 98.5°F | Resp 15 | Ht 74.0 in | Wt 282.6 lb

## 2016-12-02 DIAGNOSIS — E669 Obesity, unspecified: Secondary | ICD-10-CM

## 2016-12-02 DIAGNOSIS — M5386 Other specified dorsopathies, lumbar region: Secondary | ICD-10-CM

## 2016-12-02 DIAGNOSIS — Z79899 Other long term (current) drug therapy: Secondary | ICD-10-CM | POA: Diagnosis not present

## 2016-12-02 DIAGNOSIS — Z125 Encounter for screening for malignant neoplasm of prostate: Secondary | ICD-10-CM

## 2016-12-02 DIAGNOSIS — M539 Dorsopathy, unspecified: Secondary | ICD-10-CM | POA: Diagnosis not present

## 2016-12-02 DIAGNOSIS — I10 Essential (primary) hypertension: Secondary | ICD-10-CM

## 2016-12-02 DIAGNOSIS — E7800 Pure hypercholesterolemia, unspecified: Secondary | ICD-10-CM

## 2016-12-02 DIAGNOSIS — Z0000 Encounter for general adult medical examination without abnormal findings: Secondary | ICD-10-CM

## 2016-12-02 LAB — COMPREHENSIVE METABOLIC PANEL
ALT: 37 U/L (ref 0–53)
AST: 24 U/L (ref 0–37)
Albumin: 4 g/dL (ref 3.5–5.2)
Alkaline Phosphatase: 82 U/L (ref 39–117)
BUN: 18 mg/dL (ref 6–23)
CO2: 32 mEq/L (ref 19–32)
Calcium: 9.1 mg/dL (ref 8.4–10.5)
Chloride: 105 mEq/L (ref 96–112)
Creatinine, Ser: 0.86 mg/dL (ref 0.40–1.50)
GFR: 98.2 mL/min (ref >60.00)
Glucose, Bld: 109 mg/dL — ABNORMAL HIGH (ref 70–99)
Potassium: 4.2 mEq/L (ref 3.5–5.1)
Sodium: 140 mEq/L (ref 135–145)
Total Bilirubin: 0.4 mg/dL (ref 0.2–1.2)
Total Protein: 6.3 g/dL (ref 6.0–8.3)

## 2016-12-02 LAB — LIPID PANEL
Cholesterol: 171 mg/dL (ref 0–200)
HDL: 36 mg/dL — ABNORMAL LOW (ref >39.00)
LDL Cholesterol: 119 mg/dL — ABNORMAL HIGH (ref 0–99)
NonHDL: 134.75
Total CHOL/HDL Ratio: 5
Triglycerides: 77 mg/dL (ref 0.0–149.0)
VLDL: 15.4 mg/dL (ref 0.0–40.0)

## 2016-12-02 LAB — PSA: PSA: 0.93 ng/mL (ref 0.10–4.00)

## 2016-12-02 NOTE — Patient Instructions (Signed)
For your back pain  You can use one anti inflammatory (aleve, motrin. Etodolac) and ADD TYLENOL UP TO 2000 MG DAILY   SEE YOU IN 6 MONTHS

## 2016-12-02 NOTE — Assessment & Plan Note (Addendum)
He has been evaluated by Dashawn Nudelman following MRI results,  No surgery is advised.  Recommend weight loss and core strengthening exercise.  

## 2016-12-02 NOTE — Assessment & Plan Note (Signed)
I have addressed  BMI and recommended wt loss of 10% of body weigh over the next 6 months using a low glycemic index diet and regular exercise a minimum of 5 days per week.   

## 2016-12-02 NOTE — Assessment & Plan Note (Signed)

## 2016-12-02 NOTE — Assessment & Plan Note (Addendum)
10 yr risk is 15% .  Continue atorvastatin . He has  no side effects and liver enzymes are due  No changes today   Lab Results  Component Value Date   CHOL 171 12/02/2016   HDL 36.00 (L) 12/02/2016   LDLCALC 119 (H) 12/02/2016   LDLDIRECT 98.0 06/03/2016   TRIG 77.0 12/02/2016   CHOLHDL 5 12/02/2016   Lab Results  Component Value Date   ALT 37 12/02/2016   AST 24 12/02/2016   ALKPHOS 82 12/02/2016   BILITOT 0.4 12/02/2016

## 2016-12-02 NOTE — Progress Notes (Addendum)
Patient ID: Joseph Dalton, male    DOB: [birth year]  Age: 56 y.o. MRN: [id number]  The patient is here for annual  Preventive examination and management of other chronic and acute problems.   The risk factors are reflected in the social history.  The roster of all physicians providing medical care to patient - is listed in the Snapshot section of the chart.  Activities of daily living:  The patient is 100% independent in all ADLs: dressing, toileting, feeding as well as independent mobility  Home safety : The patient has smoke detectors in the home. They wear seatbelts.  There are no firearms at home. There is no violence in the home.   There is no risks for hepatitis, STDs or HIV. There is no   history of blood transfusion. They have no travel history to infectious disease endemic areas of the world.  The patient has seen their dentist in the last six month. They have seen their eye doctor in the last year.   They have deferred audiologic testing in the last year.    They do not  have excessive sun exposure. Discussed the need for sun protection: hats, long sleeves and use of sunscreen if there is significant sun exposure.   Diet: the importance of a healthy diet is discussed. They do have a healthy diet.  The benefits of regular aerobic exercise were discussed. She walks 4 times per week ,  20 minutes.   Depression screen: there are no signs or vegative symptoms of depression- irritability, change in appetite, anhedonia, sadness/tearfullness. The following portions of the patient's history were reviewed and updated as appropriate: allergies, current medications, past family history, past medical history,  past surgical history, past social history  and problem list.  Visual acuity was not assessed per patient preference since she has regular follow up with her ophthalmologist. Hearing and body mass index were assessed and reviewed.   During the course of the visit the patient was educated  and counseled about appropriate screening and preventive services including : fall prevention , diabetes screening, nutrition counseling, colorectal cancer screening, and recommended immunizations.    CC: The primary encounter diagnosis was Long-term use of high-risk medication. Diagnoses of Essential hypertension, Prostate cancer screening, Sciatica of left side associated with disorder of lumbar spine, Routine general medical examination at a health care facility, Obesity (BMI 30-39.9), and Pure hypercholesterolemia were also pertinent to this visit.  History Joseph Dalton has a past medical history of HTN (hypertension); Hyperlipemia; and Venous insufficiency.   Joseph Dalton has no past surgical history on file.   His family history includes Heart disease (age of onset: 4) in his father; Hyperlipidemia in his mother; Stroke in his mother.Joseph Dalton reports that Joseph Dalton has never smoked. His smokeless tobacco use includes Chew. Joseph Dalton reports that Joseph Dalton does not drink alcohol or use drugs.  Outpatient Medications Prior to Visit  Medication Sig Dispense Refill  . amLODipine-benazepril (LOTREL) 5-20 MG capsule Take 1 capsule by mouth daily. 90 capsule 3  . atorvastatin (LIPITOR) 20 MG tablet Take 1 tablet (20 mg total) by mouth daily. 90 tablet 3  . hydrochlorothiazide (HYDRODIURIL) 25 MG tablet Take 1 tablet (25 mg total) by mouth daily. 90 tablet 3  . Multiple Vitamins-Minerals (MULTIVITAMIN WITH MINERALS) tablet Take 1 tablet by mouth daily.    . cyclobenzaprine (FLEXERIL) 10 MG tablet Take 1 tablet by mouth 3 (three) times daily as needed.  2  . amLODipine-benazepril (LOTREL) 5-40 MG capsule TAKE ONE (1) CAPSULE  EACH DAY. (Patient not taking: Reported on 12/02/2016) 90 capsule 1  . etodolac (LODINE) 500 MG tablet Take 1 tablet by mouth 2 (two) times daily as needed.  1  . gentamicin ointment (GARAMYCIN) 0.1 % Apply 1 application topically 3 (three) times daily. (Patient not taking: Reported on 12/02/2016) 30 g 1   No  facility-administered medications prior to visit.     Review of Systems   Patient denies headache, fevers, malaise, unintentional weight loss, skin rash, eye pain, sinus congestion and sinus pain, sore throat, dysphagia,  hemoptysis , cough, dyspnea, wheezing, chest pain, palpitations, orthopnea, edema, abdominal pain, nausea, melena, diarrhea, constipation, flank pain, dysuria, hematuria, urinary  Frequency, nocturia, numbness, tingling, seizures,  Focal weakness, Loss of consciousness,  Tremor, insomnia, depression, anxiety, and suicidal ideation.      Objective:  BP 126/80 (BP Location: Left Arm, Patient Position: Sitting, Cuff Size: Large)   Pulse 63   Temp 98.5 F (36.9 C) (Oral)   Resp 15   Ht 6\' 2"  (1.88 m)   Wt 282 lb 9.6 oz (128.2 kg)   SpO2 96%   BMI 36.28 kg/m   Physical Exam   General appearance: alert, cooperative and appears stated age Ears: normal TM's and external ear canals both ears Throat: lips, mucosa, and tongue normal; teeth and gums normal Neck: no adenopathy, no carotid bruit, supple, symmetrical, trachea midline and thyroid not enlarged, symmetric, no tenderness/mass/nodules Back: symmetric, no curvature. ROM normal. No CVA tenderness. Lungs: clear to auscultation bilaterally Heart: regular rate and rhythm, S1, S2 normal, no murmur, click, rub or gallop Abdomen: obese,  soft, non-tender; bowel sounds normal; no masses,  no organomegaly Pulses: 2+ and symmetric Skin: Skin color, texture, turgor normal. No rashes or lesions Lymph nodes: Cervical, supraclavicular, and axillary nodes normal.    Assessment & Plan:   Problem List Items Addressed This Visit    Hyperlipidemia    10 yr risk is 15% .  Continue atorvastatin . Joseph Dalton has  no side effects and liver enzymes are due  No changes today   Lab Results  Component Value Date   CHOL 171 12/02/2016   HDL 36.00 (L) 12/02/2016   LDLCALC 119 (H) 12/02/2016   LDLDIRECT 98.0 06/03/2016   TRIG 77.0  12/02/2016   CHOLHDL 5 12/02/2016   Lab Results  Component Value Date   ALT 37 12/02/2016   AST 24 12/02/2016   ALKPHOS 82 12/02/2016   BILITOT 0.4 12/02/2016         Hypertension    Well controlled on current regimen. Renal function is due , no changes today.      Relevant Orders   Comprehensive metabolic panel (Completed)   Obesity (BMI 30-39.9)    I have addressed  BMI and recommended wt loss of 10% of body weigh over the next 6 months using a low glycemic index diet and regular exercise a minimum of 5 days per week.        Routine general medical examination at a health care facility    Annual comprehensive preventive exam was done as well as an evaluation and management of acute and chronic conditions .  During the course of the visit the patient was educated and counseled about appropriate screening and preventive services including :  diabetes screening, lipid analysis with projected  10 year  risk for CAD , nutrition counseling, prostate and colorectal cancer screening, and recommended immunizations.  Printed recommendations for health maintenance screenings was given.  Sciatica of left side associated with disorder of lumbar spine    Joseph Dalton has been evaluated by Jovita Gamma following MRI results,  No surgery is advised.  Recommend weight loss and core strengthening exercise.        Other Visit Diagnoses    Long-term use of high-risk medication    -  Primary   Relevant Orders   Lipid panel (Completed)   Prostate cancer screening       Relevant Orders   PSA (Completed)      I have discontinued Mr. Radler gentamicin ointment and etodolac. I am also having him maintain his multivitamin with minerals, amLODipine-benazepril, atorvastatin, hydrochlorothiazide, and cyclobenzaprine.  No orders of the defined types were placed in this encounter.   Medications Discontinued During This Encounter  Medication Reason  . amLODipine-benazepril (LOTREL) 5-61 MG capsule  Duplicate  . etodolac (LODINE) 500 MG tablet Patient has not taken in last 30 days  . gentamicin ointment (GARAMYCIN) 0.1 % Patient has not taken in last 30 days    Follow-up: Return in about 6 months (around 06/04/2017).   [name], MD

## 2016-12-02 NOTE — Assessment & Plan Note (Signed)
Well controlled on current regimen. Renal function is due, no changes today. °

## 2017-04-03 ENCOUNTER — Other Ambulatory Visit: Payer: Self-pay | Admitting: Internal Medicine

## 2017-04-03 DIAGNOSIS — I10 Essential (primary) hypertension: Secondary | ICD-10-CM

## 2017-06-11 ENCOUNTER — Encounter: Payer: Self-pay | Admitting: Internal Medicine

## 2017-06-11 ENCOUNTER — Ambulatory Visit: Payer: BLUE CROSS/BLUE SHIELD | Admitting: Internal Medicine

## 2017-06-11 VITALS — BP 102/74 | HR 58 | Temp 98.1°F | Resp 15 | Ht 74.0 in | Wt 282.0 lb

## 2017-06-11 DIAGNOSIS — R7301 Impaired fasting glucose: Secondary | ICD-10-CM

## 2017-06-11 DIAGNOSIS — E669 Obesity, unspecified: Secondary | ICD-10-CM | POA: Diagnosis not present

## 2017-06-11 DIAGNOSIS — E7800 Pure hypercholesterolemia, unspecified: Secondary | ICD-10-CM

## 2017-06-11 DIAGNOSIS — M778 Other enthesopathies, not elsewhere classified: Secondary | ICD-10-CM

## 2017-06-11 DIAGNOSIS — I10 Essential (primary) hypertension: Secondary | ICD-10-CM

## 2017-06-11 LAB — COMPREHENSIVE METABOLIC PANEL
ALT: 32 U/L (ref 0–53)
AST: 22 U/L (ref 0–37)
Albumin: 4 g/dL (ref 3.5–5.2)
Alkaline Phosphatase: 85 U/L (ref 39–117)
BUN: 17 mg/dL (ref 6–23)
CO2: 31 mEq/L (ref 19–32)
Calcium: 9.4 mg/dL (ref 8.4–10.5)
Chloride: 102 mEq/L (ref 96–112)
Creatinine, Ser: 0.89 mg/dL (ref 0.40–1.50)
GFR: 94.21 mL/min (ref >60.00)
Glucose, Bld: 106 mg/dL — ABNORMAL HIGH (ref 70–99)
Potassium: 4.2 mEq/L (ref 3.5–5.1)
Sodium: 138 mEq/L (ref 135–145)
Total Bilirubin: 0.7 mg/dL (ref 0.2–1.2)
Total Protein: 6.8 g/dL (ref 6.0–8.3)

## 2017-06-11 LAB — LIPID PANEL
Cholesterol: 161 mg/dL (ref 0–200)
HDL: 39.1 mg/dL (ref >39.00)
LDL Cholesterol: 93 mg/dL (ref 0–99)
NonHDL: 121.53
Total CHOL/HDL Ratio: 4
Triglycerides: 143 mg/dL (ref 0.0–149.0)
VLDL: 28.6 mg/dL (ref 0.0–40.0)

## 2017-06-11 LAB — MICROALBUMIN / CREATININE URINE RATIO
Creatinine,U: 98 mg/dL
Microalb Creat Ratio: 0.7 mg/g (ref 0.0–30.0)
Microalb, Ur: <0.7 mg/dL (ref 0.0–1.9)

## 2017-06-11 LAB — HEMOGLOBIN A1C: Hgb A1c MFr Bld: 5.4 % (ref 4.6–6.5)

## 2017-06-11 NOTE — Patient Instructions (Addendum)
I want you to lose 10 lbs over the next 6 months by :  Reducing your portion size or reducing servings of starch to 2 per day (potatoes, rice, bread or pasta)   Getting 30 minutes of exercise (eventually 5 days per week): stationery bike may be easier on your back than treadmill  MODERATION IN ALL THINGS IS STILL THE BEST REGIMEN!!  Referral to Dr Tamala Julian for elbow tendonitis is in progress   .

## 2017-06-11 NOTE — Progress Notes (Signed)
Subjective:  Patient ID: Joseph Dalton, male    DOB: May 23, 1960  Age: 57 y.o. MRN: [id number]  CC: The primary encounter diagnosis was Pure hypercholesterolemia. Diagnoses of Essential hypertension, Impaired fasting glucose, Tendonitis of elbow, right, and Obesity (BMI 30-39.9) were also pertinent to this visit.  HPI [name] presents for 6 month follow up on Hypertension  and hyperlipidemia : patient does not check blood pressure at home.  Patient is following a reduce salt diet most days and is taking medications as prescribed  No back pain recently,  Last "spell" was during trip to [place].  Occurred after a week of vacation , bent over to tie shoe  . Used the flexeril, motrin and tylenol  .  [place] showed DJD  .  No surgery advised.   Obesity: diet reviewed.  Not starch laden. But loves potatoes , at least once daily . Did not like the mashed cauliflower.  Wife is losing weight .  Does not drink alcohol every night,  But when he does he drinks a few at a time.  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   Right elbow pain . Aggravated by squeezing hand.   Wants to see sports medicine doc   Outpatient Medications Prior to Visit  Medication Sig Dispense Refill  . amLODipine-benazepril (LOTREL) 5-40 MG capsule TAKE (1) CAPSULE BY MOUTH EVERY DAY 90 capsule 1  . atorvastatin (LIPITOR) 20 MG tablet TAKE ONE TABLET AT BEDTIME. 90 tablet 3  . cyclobenzaprine (FLEXERIL) 10 MG tablet Take 1 tablet by mouth 3 (three) times daily as needed.  2  . hydrochlorothiazide (HYDRODIURIL) 25 MG tablet TAKE (1) TABLET BY MOUTH EVERY DAY 90 tablet 3  . Multiple Vitamins-Minerals (MULTIVITAMIN WITH MINERALS) tablet Take 1 tablet by mouth daily.    Marland Kitchen amLODipine-benazepril (LOTREL) 5-20 MG capsule Take 1 capsule by mouth daily. 90  capsule 3   No facility-administered medications prior to visit.     Review of Systems;  Patient denies headache, fevers, malaise, unintentional weight loss, skin rash, eye pain, sinus congestion and sinus pain, sore throat, dysphagia,  hemoptysis , cough, dyspnea, wheezing, chest pain, palpitations, orthopnea, edema, abdominal pain, nausea, melena, diarrhea, constipation, flank pain, dysuria, hematuria, urinary  Frequency, nocturia, numbness, tingling, seizures,  Focal weakness, Loss of consciousness,  Tremor, insomnia, depression, anxiety, and suicidal ideation.      Objective:  BP 102/74 (BP Location: Left Arm, Patient Position: Sitting, Cuff Size: Large)   Pulse (!) 58   Temp 98.1 F (36.7 C) (Oral)   Resp 15   Ht 6\' 2"  (1.88 m)   Wt 282 lb (127.9 kg)   SpO2 95%   BMI 36.21 kg/m   BP Readings from Last 3 Encounters:  06/11/17 102/74  12/02/16 126/80  06/03/16 128/90    Wt Readings from Last 3 Encounters:  06/11/17 282 lb (127.9 kg)  12/02/16 282 lb 9.6 oz (128.2 kg)  06/03/16 281 lb (127.5 kg)    General appearance: alert, cooperative and appears stated age Ears: normal TM's and external ear canals both ears Throat: lips, mucosa, and tongue normal; teeth and gums normal Neck: no adenopathy, no carotid bruit, supple, symmetrical, trachea midline and thyroid not enlarged, symmetric, no tenderness/mass/nodules Back: symmetric, no  curvature. ROM normal. No CVA tenderness. Lungs: clear to auscultation bilaterally Heart: regular rate and rhythm, S1, S2 normal, no murmur, click, rub or gallop Abdomen: soft, non-tender; bowel sounds normal; no masses,  no organomegaly Pulses: 2+ and symmetric Skin: Skin color, texture, turgor normal. No rashes or lesions Lymph nodes: Cervical, supraclavicular, and axillary nodes normal.  Lab Results  Component Value Date   HGBA1C 5.4 06/11/2017   HGBA1C 5.4 06/03/2016    Lab Results  Component Value Date   CREATININE 0.89 06/11/2017    CREATININE 0.86 12/02/2016   CREATININE 0.82 06/03/2016    Lab Results  Component Value Date   WBC 6.6 10/05/2015   HGB 15.9 10/05/2015   HCT 46.4 10/05/2015   PLT 191.0 10/05/2015   GLUCOSE 106 (H) 06/11/2017   CHOL 161 06/11/2017   TRIG 143.0 06/11/2017   HDL 39.10 06/11/2017   LDLDIRECT 98.0 06/03/2016   LDLCALC 93 06/11/2017   ALT 32 06/11/2017   AST 22 06/11/2017   NA 138 06/11/2017   K 4.2 06/11/2017   CL 102 06/11/2017   CREATININE 0.89 06/11/2017   BUN 17 06/11/2017   CO2 31 06/11/2017   TSH 0.75 11/22/2013   PSA 0.93 12/02/2016   HGBA1C 5.4 06/11/2017   MICROALBUR <0.7 06/11/2017     Assessment & Plan:   Problem List Items Addressed This Visit    Hyperlipidemia - Primary    10 yr risk is 15% .  Continue atorvastatin . He has  no side effects and liver enzymes are due  No changes today   Lab Results  Component Value Date   CHOL 161 06/11/2017   HDL 39.10 06/11/2017   LDLCALC 93 06/11/2017   LDLDIRECT 98.0 06/03/2016   TRIG 143.0 06/11/2017   CHOLHDL 4 06/11/2017   Lab Results  Component Value Date   ALT 32 06/11/2017   AST 22 06/11/2017   ALKPHOS 85 06/11/2017   BILITOT 0.7 06/11/2017         Relevant Orders   Lipid panel (Completed)   Hypertension    Well controlled on current regimen. Renal function stable, no changes today.  Lab Results  Component Value Date   CREATININE 0.89 06/11/2017   Lab Results  Component Value Date   NA 138 06/11/2017   K 4.2 06/11/2017   CL 102 06/11/2017   CO2 31 06/11/2017         Relevant Orders   Comprehensive metabolic panel (Completed)   Microalbumin / creatinine urine ratio (Completed)   Obesity (BMI 30-39.9)    I have addressed  BMI and recommended wt loss of 10% of body weight over the next 6 months using a low glycemic index diet and regular exercise a minimum of 5 days per week.         Other Visit Diagnoses    Impaired fasting glucose       Relevant Orders   Hemoglobin A1c  (Completed)   Tendonitis of elbow, right       Relevant Orders   Ambulatory referral to Sports Medicine    A total of 25 minutes of face to face time was spent with patient more than half of which was spent in counselling about the above mentioned conditions  and coordination of care   I am having Brittney L. Zupko maintain his multivitamin with minerals, cyclobenzaprine, amLODipine-benazepril, atorvastatin, and hydrochlorothiazide.  No orders of the defined types were placed in this encounter.   Medications Discontinued During This Encounter  Medication Reason  . amLODipine-benazepril (LOTREL) 5-20 MG capsule     Follow-up: Return in about 6 months (around 12/12/2017), or CPE.   [name], MD

## 2017-06-14 NOTE — Assessment & Plan Note (Signed)
Well controlled on current regimen. Renal function stable, no changes today.  Lab Results  Component Value Date   CREATININE 0.89 06/11/2017   Lab Results  Component Value Date   NA 138 06/11/2017   K 4.2 06/11/2017   CL 102 06/11/2017   CO2 31 06/11/2017

## 2017-06-14 NOTE — Assessment & Plan Note (Signed)
10 yr risk is 15% .  Continue atorvastatin . He has  no side effects and liver enzymes are due  No changes today   Lab Results  Component Value Date   CHOL 161 06/11/2017   HDL 39.10 06/11/2017   LDLCALC 93 06/11/2017   LDLDIRECT 98.0 06/03/2016   TRIG 143.0 06/11/2017   CHOLHDL 4 06/11/2017   Lab Results  Component Value Date   ALT 32 06/11/2017   AST 22 06/11/2017   ALKPHOS 85 06/11/2017   BILITOT 0.7 06/11/2017

## 2017-06-14 NOTE — Assessment & Plan Note (Signed)
I have addressed  BMI and recommended wt loss of 10% of body weight over the next 6 months using a low glycemic index diet and regular exercise a minimum of 5 days per week.   

## 2017-06-16 ENCOUNTER — Encounter: Payer: Self-pay | Admitting: Family Medicine

## 2017-06-16 ENCOUNTER — Telehealth: Payer: Self-pay | Admitting: *Deleted

## 2017-06-16 ENCOUNTER — Other Ambulatory Visit: Payer: Self-pay

## 2017-06-16 ENCOUNTER — Ambulatory Visit: Payer: BLUE CROSS/BLUE SHIELD | Admitting: Family Medicine

## 2017-06-16 VITALS — BP 124/86 | HR 67 | Temp 98.5°F | Ht 74.0 in | Wt 281.0 lb

## 2017-06-16 DIAGNOSIS — M7711 Lateral epicondylitis, right elbow: Secondary | ICD-10-CM | POA: Diagnosis not present

## 2017-06-16 MED ORDER — METHYLPREDNISOLONE ACETATE 40 MG/ML IJ SUSP
20.0000 mg | Freq: Once | INTRAMUSCULAR | Status: AC
  Administered 2017-06-16: 20 mg via INTRA_ARTICULAR

## 2017-06-16 MED ORDER — DICLOFENAC SODIUM 75 MG PO TBEC: 75.0000 mg | DELAYED_RELEASE_TABLET | Freq: Two times a day (BID) | ORAL | 2 refills | Status: AC

## 2017-06-16 NOTE — Telephone Encounter (Signed)
  It appears that you  saw Joseph Dalton today and had an injection.  Do you still want to see Dr Tamala Julian ?

## 2017-06-16 NOTE — Telephone Encounter (Signed)
Copied from [place] [id number]. Topic: General - Other >> Jun 16, 2017  8:02 AM Yvette Rack wrote: Reason for CRM: patient would like to have his referral with Dr Tamala Julian for his elbow he states that Dr Derrel Nip told him that his referral would be with him since hes seen him before

## 2017-06-16 NOTE — Telephone Encounter (Signed)
Please advise 

## 2017-06-16 NOTE — Progress Notes (Signed)
Dr. Frederico Hamman T. Artavia Jeanlouis, MD, [facility] [place], [address] Phone: [phone] Fax: [phone]  06/16/2017  Patient: Joseph Dalton, MRN: [id number], DOB: [birth year], 57 y.o.  Primary Physician:  [name], MD   Chief Complaint  Patient presents with  . Elbow Pain    Right x 3 weeks   Subjective:   [name] presents with lateral elbow pain.  Length of symptoms: 3-4 weeks Hand effected: R  Patient describes a dull ache on the lateral elbow. There is some translation in the proximal forearm and in the distal upper arm. It is painful to lift with the hand facing down and to lift with the thumb in an upright position. Supination is painful. Patient points to the lateral epicondyle as the point of maximal tenderness near ECRB.  Works with hands all the time.   No trauma.   No prior fractures or operative interventions in the effective hand. Prior PT or HEP: none  Denies numbness or tingling. No significant neck or shoulder pain.  Hand of dominance: RHD  The PMH, PSH, Social History, Family History, Medications, and allergies have been reviewed in [facility], and have been updated if relevant.  Patient Active Problem List   Diagnosis Date Noted  . Influenza 04/22/2016  . Sciatica of left side associated with disorder of lumbar spine 05/11/2015  . Routine general medical examination at a health care facility 11/22/2013  . Obesity (BMI 30-39.9) 11/22/2013  . Hyperlipidemia 05/30/2011  . Hypertension 05/30/2011    Past Medical History:  Diagnosis Date  . HTN (hypertension)   . Hyperlipemia   . Venous insufficiency     History reviewed. No pertinent surgical history.  Social History   Socioeconomic History  . Marital status: Married    Spouse name: Not on file  . Number of children: 1  . Years of education: Not on file  . Highest education level: Not on file  Social Needs  . Financial resource  strain: Not on file  . Food insecurity - worry: Not on file  . Food insecurity - inability: Not on file  . Transportation needs - medical: Not on file  . Transportation needs - non-medical: Not on file  Occupational History  . Occupation: Self Employed - Teaching laboratory technician  Tobacco Use  . Smoking status: Never Smoker  . Smokeless tobacco: Current User    Types: Chew  Substance and Sexual Activity  . Alcohol use: No    Comment: Occasional  . Drug use: No  . Sexual activity: Not on file  Other Topics Concern  . Not on file  Social History Narrative   Lives in [place] with wife, 1 daughter. Dog.  Works as Psychiatric nurse.       Regular Exercise -  NO   Daily Caffeine Use:  1 cup coffee and 1 soda a day    Family History  Problem Relation Age of Onset  . Stroke Mother   . Hyperlipidemia Mother   . Heart disease Father 64       heart attack    No Known Allergies  Medication list reviewed and updated in full in [facility].  GEN: No fevers, chills. Nontoxic. Primarily MSK c/o today. MSK: Detailed in the HPI GI: tolerating PO intake without difficulty Neuro: No numbness, parasthesias, or tingling associated. Otherwise the pertinent positives of the ROS are noted above.   Objective:   Blood pressure 124/86,  pulse 67, temperature 98.5 F (36.9 C), temperature source Oral, height 6\' 2"  (1.88 m), weight 281 lb (127.5 kg).  GEN: Well-developed,well-nourished,in no acute distress; alert,appropriate and cooperative throughout examination HEENT: Normocephalic and atraumatic without obvious abnormalities. Ears, externally no deformities PULM: Breathing comfortably in no respiratory distress EXT: No clubbing, cyanosis, or edema PSYCH: Normally interactive. Cooperative during the interview. Pleasant. Friendly and conversant. Not anxious or depressed appearing. Normal, full affect.  R elbow Ecchymosis or edema: neg ROM: full flexion, extension, pronation,  supination Shoulder ROM: Full Flexion: 5/5 Extension: 5/5, PAINFUL Supination: 5/5, PAINFUL Pronation: 5/5 Wrist ext: 5/5 Wrist flexion: 5/5 No gross bony abnormality Varus and Valgus stress: stable ECRB tenderness: YES, TTP Medial epicondyle: NT Lateral epicondyle, resisted wrist extension from wrist full pronation and flexion: PAINFUL grip: 5/5  sensation intact Tinel's, Elbow: negative  Subjective:   Right lateral epicondylitis - Plan: methylPREDNISolone acetate (DEPO-MEDROL) injection 20 mg  Elbow anatomy was reviewed, and tendinopathy was explained.  Pt. given a formal rehab program from [facility] on elbow rehabiliation.  Series of concentric and eccentric exercises should be done starting with no weight, work up to 1 lb, hammer, etc. -- reviewed specific for his work.  Use counterforce strap if working or using hands.  Formal PT would be beneficial. Emphasized stretching an cross-friction massage Emphasized proper palms up lifting biomechanics to unload ECRB  Lateral Epicondylitis Injection, R Verbal consent was obtained from the patient. Risks, benefits, and alternatives were discussed. Potential complications including loss of pigment, atrophy, and rare risk of infection were discussed. Prepped with Chloraprep and Ethyl Chloride used for anesthesia. Under sterile conditions, the patient was injected at the point of maximal tenderness at the ECRB tendon with 1 cc of Lidocaine 1% and 1/2 cc of Depo-Medrol 40 mg. Decreased pain after injection. No complications.  Needle size: 22 gauge 1 1/2 inch   Follow-up: 4-6 weeks if not improved  Meds ordered this encounter  Medications  . diclofenac (VOLTAREN) 75 MG EC tablet    Sig: Take 1 tablet (75 mg total) by mouth 2 (two) times daily.    Dispense:  60 tablet    Refill:  2  . methylPREDNISolone acetate (DEPO-MEDROL) injection 20 mg   Signed,  Marvelyn Bouchillon T. Hend Mccarrell, MD   Patient's Medications  New Prescriptions    DICLOFENAC (VOLTAREN) 75 MG EC TABLET    Take 1 tablet (75 mg total) by mouth 2 (two) times daily.  Previous Medications   AMLODIPINE-BENAZEPRIL (LOTREL) 5-40 MG CAPSULE    TAKE (1) CAPSULE BY MOUTH EVERY DAY   ATORVASTATIN (LIPITOR) 20 MG TABLET    TAKE ONE TABLET AT BEDTIME.   CYCLOBENZAPRINE (FLEXERIL) 10 MG TABLET    Take 1 tablet by mouth 3 (three) times daily as needed.   HYDROCHLOROTHIAZIDE (HYDRODIURIL) 25 MG TABLET    TAKE (1) TABLET BY MOUTH EVERY DAY   MULTIPLE VITAMINS-MINERALS (MULTIVITAMIN WITH MINERALS) TABLET    Take 1 tablet by mouth daily.  Modified Medications   No medications on file  Discontinued Medications   No medications on file

## 2017-06-17 NOTE — Telephone Encounter (Signed)
Spoke with pt and he stated that he does not still need the referral to Dr. Tamala Julian.

## 2017-06-30 ENCOUNTER — Encounter: Payer: Self-pay | Admitting: Internal Medicine

## 2017-07-14 ENCOUNTER — Ambulatory Visit
Admission: EM | Admit: 2017-07-14 | Discharge: 2017-07-14 | Disposition: A | Discharge status: Home / Self Care | Payer: BLUE CROSS/BLUE SHIELD | Attending: Family Medicine | Admitting: Family Medicine

## 2017-07-14 ENCOUNTER — Ambulatory Visit: Payer: BLUE CROSS/BLUE SHIELD | Admitting: Family

## 2017-07-14 ENCOUNTER — Other Ambulatory Visit: Payer: Self-pay

## 2017-07-14 DIAGNOSIS — R69 Illness, unspecified: Secondary | ICD-10-CM | POA: Diagnosis not present

## 2017-07-14 DIAGNOSIS — Z0289 Encounter for other administrative examinations: Secondary | ICD-10-CM

## 2017-07-14 DIAGNOSIS — J111 Influenza due to unidentified influenza virus with other respiratory manifestations: Secondary | ICD-10-CM

## 2017-07-14 MED ORDER — OSELTAMIVIR PHOSPHATE 75 MG PO CAPS: 75.0000 mg | ORAL_CAPSULE | Freq: Two times a day (BID) | ORAL | 0 refills | Status: DC

## 2017-07-14 NOTE — ED Provider Notes (Signed)
[facility] ____________________________________________  Time seen: Approximately 11:25 AM  I have reviewed the triage vital signs and the nursing notes.   HISTORY  Chief Complaint Fever   HPI Joseph Dalton is a 57 y.o. male presenting for evaluation of cough, nasal congestion, chills, body aches and fever that started on Saturday.  Reports fever 101 yesterday.  States has been taken over-the-counter Tylenol and Coricidin congestion medication.  States that his wife was diagnosed with influenza this past Thursday.  Reports quick onset of symptoms.  Denies sore throat.  States cough is mild.  Continues to drink fluids well, decreased appetite.  Denies other aggravating or alleviating factors. Denies chest pain, shortness of breath, abdominal pain, dysuria, extremity pain, extremity swelling or rash. Denies recent sickness. Denies recent antibiotic use.  Denies cardiac history.  Denies renal insufficiency.  [name], MD: PCP   Past Medical History:  Diagnosis Date  . HTN (hypertension)   . Hyperlipemia   . Venous insufficiency     Patient Active Problem List   Diagnosis Date Noted  . Influenza 04/22/2016  . Sciatica of left side associated with disorder of lumbar spine 05/11/2015  . Routine general medical examination at a health care facility 11/22/2013  . Obesity (BMI 30-39.9) 11/22/2013  . Hyperlipidemia 05/30/2011  . Hypertension 05/30/2011    Past Surgical History:  Procedure Laterality Date  . NO PAST SURGERIES       No current facility-administered medications for this encounter.   Current Outpatient Medications:  .  amLODipine-benazepril (LOTREL) 5-40 MG capsule, TAKE (1) CAPSULE BY MOUTH EVERY DAY, Disp: 90 capsule, Rfl: 1 .  atorvastatin (LIPITOR) 20 MG tablet, TAKE ONE TABLET AT BEDTIME., Disp: 90 tablet, Rfl: 3 .  cyclobenzaprine (FLEXERIL) 10 MG tablet, Take 1 tablet by mouth 3 (three) times daily as needed., Disp: , Rfl: 2 .   diclofenac (VOLTAREN) 75 MG EC tablet, Take 1 tablet (75 mg total) by mouth 2 (two) times daily., Disp: 60 tablet, Rfl: 2 .  hydrochlorothiazide (HYDRODIURIL) 25 MG tablet, TAKE (1) TABLET BY MOUTH EVERY DAY, Disp: 90 tablet, Rfl: 3 .  Multiple Vitamins-Minerals (MULTIVITAMIN WITH MINERALS) tablet, Take 1 tablet by mouth daily., Disp: , Rfl:  .  oseltamivir (TAMIFLU) 75 MG capsule, Take 1 capsule (75 mg total) by mouth every 12 (twelve) hours., Disp: 10 capsule, Rfl: 0  Allergies Patient has no known allergies.  Family History  Problem Relation Age of Onset  . Stroke Mother   . Hyperlipidemia Mother   . Heart disease Father 68       heart attack    Social History Social History   Tobacco Use  . Smoking status: Never Smoker  . Smokeless tobacco: Current User    Types: Chew  Substance Use Topics  . Alcohol use: Yes    Comment: Occasional  . Drug use: No    Review of Systems Constitutional: As above.  ENT: No sore throat. Cardiovascular: Denies chest pain. Respiratory: Denies shortness of breath. Gastrointestinal: No abdominal pain.   Musculoskeletal: Negative for back pain. Skin: Negative for rash.   ____________________________________________   PHYSICAL EXAM:  VITAL SIGNS: ED Triage Vitals  Enc Vitals Group     BP 07/14/17 1035 (!) 124/97     Pulse Rate 07/14/17 1035 77     Resp 07/14/17 1035 18     Temp 07/14/17 1035 99.7 F (37.6 C)     Temp Source 07/14/17 1035 Oral     SpO2 07/14/17  1035 96 %     Weight 07/14/17 1033 265 lb (120.2 kg)     Height 07/14/17 1033 6\' 2"  (1.88 m)     Head Circumference --      Peak Flow --      Pain Score 07/14/17 1033 8     Pain Loc --      Pain Edu? --      Excl. in [place]? --     Constitutional: Alert and oriented. Well appearing and in no acute distress. Eyes: Conjunctivae are normal. Head: Atraumatic. No sinus tenderness to palpation. No swelling. No erythema.  Ears: no erythema, normal TMs bilaterally.   Nose:Nasal  congestion   Mouth/Throat: Mucous membranes are moist. No pharyngeal erythema. No tonsillar swelling or exudate.  Neck: No stridor.  No cervical spine tenderness to palpation. Hematological/Lymphatic/Immunilogical: No cervical lymphadenopathy. Cardiovascular: Normal rate, regular rhythm. Grossly normal heart sounds. Good peripheral circulation. Respiratory: Normal respiratory effort.  No retractions. No wheezes, rales or rhonchi. Good air movement.  Musculoskeletal: Steady gait. No cervical, thoracic or lumbar tenderness to palpation. Neurologic:  Normal speech and language. No gait instability. Skin:  Skin appears warm, dry and intact. No rash noted. Psychiatric: Mood and affect are normal. Speech and behavior are normal.  ___________________________________________   LABS (all labs ordered are listed, but only abnormal results are displayed)  Labs Reviewed - No data to display ____________________________________________  PROCEDURES Procedures   INITIAL IMPRESSION / ASSESSMENT AND PLAN / ED COURSE  Pertinent labs & imaging results that were available during my care of the patient were reviewed by me and considered in my medical decision making (see chart for details).  Well-appearing patient.  No acute distress.  Suspect influenza-like illness.  Discussed treatment options with patient.  Will treat with oral Tamiflu.  Denies need for prescription cough medication at this time.  Encourage rest, fluids, supportive care, antipyretics and Tamiflu.  Discussed follow-up and return parameters. Discussed indication, risks and benefits of medications with patient.  Discussed follow up with Primary care physician this week. Discussed follow up and return parameters including no resolution or any worsening concerns. Patient verbalized understanding and agreed to plan.   ____________________________________________   FINAL CLINICAL IMPRESSION(S) / ED DIAGNOSES  Final diagnoses:    Influenza-like illness     ED Discharge Orders        Ordered    oseltamivir (TAMIFLU) 75 MG capsule  Every 12 hours     07/14/17 1122       Note: This dictation was prepared with Dragon dictation along with smaller phrase technology. Any transcriptional errors that result from this process are unintentional.         Marylene Land, NP 07/14/17 1404

## 2017-07-14 NOTE — Discharge Instructions (Signed)
Take medication as prescribed. Rest. Drink plenty of fluids.  ° °Follow up with your primary care physician this week as needed. Return to Urgent care for new or worsening concerns.  ° °

## 2017-07-14 NOTE — ED Triage Notes (Addendum)
Patient complains of cough, congestion, fever, body aches since Saturday. Patient states that he has been taking Coricidin. Patient reports that wife has the flu.

## 2017-10-13 ENCOUNTER — Other Ambulatory Visit: Payer: Self-pay | Admitting: Internal Medicine

## 2017-12-19 ENCOUNTER — Ambulatory Visit (INDEPENDENT_AMBULATORY_CARE_PROVIDER_SITE_OTHER): Payer: BLUE CROSS/BLUE SHIELD | Admitting: Internal Medicine

## 2017-12-19 ENCOUNTER — Encounter: Payer: Self-pay | Admitting: Internal Medicine

## 2017-12-19 VITALS — BP 120/82 | HR 62 | Temp 98.5°F | Resp 14 | Ht 74.0 in | Wt 270.0 lb

## 2017-12-19 DIAGNOSIS — E7800 Pure hypercholesterolemia, unspecified: Secondary | ICD-10-CM

## 2017-12-19 DIAGNOSIS — I10 Essential (primary) hypertension: Secondary | ICD-10-CM

## 2017-12-19 DIAGNOSIS — Z125 Encounter for screening for malignant neoplasm of prostate: Secondary | ICD-10-CM

## 2017-12-19 DIAGNOSIS — Z8371 Family history of colonic polyps: Secondary | ICD-10-CM | POA: Diagnosis not present

## 2017-12-19 DIAGNOSIS — Z0000 Encounter for general adult medical examination without abnormal findings: Secondary | ICD-10-CM | POA: Diagnosis not present

## 2017-12-19 DIAGNOSIS — E669 Obesity, unspecified: Secondary | ICD-10-CM

## 2017-12-19 LAB — COMPREHENSIVE METABOLIC PANEL
ALT: 27 U/L (ref 0–53)
AST: 17 U/L (ref 0–37)
Albumin: 3.9 g/dL (ref 3.5–5.2)
Alkaline Phosphatase: 72 U/L (ref 39–117)
BUN: 21 mg/dL (ref 6–23)
CO2: 26 mEq/L (ref 19–32)
Calcium: 8.9 mg/dL (ref 8.4–10.5)
Chloride: 106 mEq/L (ref 96–112)
Creatinine, Ser: 0.86 mg/dL (ref 0.40–1.50)
GFR: 97.82 mL/min (ref >60.00)
Glucose, Bld: 100 mg/dL — ABNORMAL HIGH (ref 70–99)
Potassium: 3.9 mEq/L (ref 3.5–5.1)
Sodium: 142 mEq/L (ref 135–145)
Total Bilirubin: 0.8 mg/dL (ref 0.2–1.2)
Total Protein: 6.1 g/dL (ref 6.0–8.3)

## 2017-12-19 LAB — LIPID PANEL
Cholesterol: 141 mg/dL (ref 0–200)
HDL: 33 mg/dL — ABNORMAL LOW (ref >39.00)
LDL Cholesterol: 88 mg/dL (ref 0–99)
NonHDL: 107.67
Total CHOL/HDL Ratio: 4
Triglycerides: 97 mg/dL (ref 0.0–149.0)
VLDL: 19.4 mg/dL (ref 0.0–40.0)

## 2017-12-19 LAB — PSA: PSA: 1.1 ng/mL (ref 0.10–4.00)

## 2017-12-19 MED ORDER — DOXYCYCLINE HYCLATE 100 MG PO TABS: 100.0000 mg | ORAL_TABLET | Freq: Two times a day (BID) | ORAL | 0 refills | Status: DC

## 2017-12-19 NOTE — Progress Notes (Signed)
Patient ID: Joseph Dalton, male    DOB: Oct 07, 1960  Age: 57 y.o. MRN: [id number]  The patient is here for annual preventive  examination and management of other chronic and acute problems.  Due for 5 yr follow up  c olonoscopy    The risk factors are reflected in the social history.  The roster of all physicians providing medical care to patient - is listed in the Snapshot section of the chart.  Activities of daily living:  The patient is 100% independent in all ADLs: dressing, toileting, feeding as well as independent mobility  Home safety : The patient has smoke detectors in the home. They wear seatbelts.  There are no firearms at home. There is no violence in the home.   There is no risks for hepatitis, STDs or HIV. There is no   history of blood transfusion. They have no travel history to infectious disease endemic areas of the world.  The patient has seen their dentist in the last six month. They have seen their eye doctor in the last year. They admit to slight hearing difficulty with regard to whispered voices and some television programs.  They have deferred audiologic testing in the last year.  They do not  have excessive sun exposure. Discussed the need for sun protection: hats, long sleeves and use of sunscreen if there is significant sun exposure.   Diet: the importance of a healthy diet is discussed. They do have a healthy diet.  The benefits of regular aerobic exercise were discussed. He has a physically demanding job so he does not exercise regularly    Depression screen: there are no signs or vegative symptoms of depression- irritability, change in appetite, anhedonia, sadness/tearfullness.  Cognitive assessment: the patient manages all their financial and personal affairs and is actively engaged. They could relate day,date,year and events; recalled 2/3 objects at 3 minutes; performed clock-face test normally.  The following portions of the patient's history were reviewed and  updated as appropriate: allergies, current medications, past family history, past m 1) occasional insomnia   Trouble initiedical history,  past surgical history, past social history  and problem list.  Visual acuity was not assessed per patient preference since she has regular follow up with her ophthalmologist. Hearing and body mass index were assessed and reviewed.   During the course of the visit the patient was educated and counseled about appropriate screening and preventive services including : fall prevention , diabetes screening, nutrition counseling, colorectal cancer screening, and recommended immunizations.    CC: The primary encounter diagnosis was Family history of polyps in the colon. Diagnoses of Pure hypercholesterolemia, Prostate cancer screening, Essential hypertension, Routine general medical examination at a health care facility, and Obesity (BMI 30-39.9) were also pertinent to this visit.   1)  occasional insomnia ,  Trouble initiating sleep ,  Occurring 1-2 times per week.    History Joseph Dalton has a past medical history of HTN (hypertension), Hyperlipemia, and Venous insufficiency.   He has a past surgical history that includes No past surgeries.   His family history includes Heart disease (age of onset: 76) in his father; Hyperlipidemia in his mother; Stroke in his mother.He reports that he has never smoked. His smokeless tobacco use includes chew. He reports that he drinks alcohol. He reports that he does not use drugs.  Outpatient Medications Prior to Visit  Medication Sig Dispense Refill  . amLODipine-benazepril (LOTREL) 5-40 MG capsule TAKE (1) CAPSULE BY MOUTH EVERY DAY 90 capsule 1  .  atorvastatin (LIPITOR) 20 MG tablet TAKE ONE TABLET AT BEDTIME. 90 tablet 3  . hydrochlorothiazide (HYDRODIURIL) 25 MG tablet TAKE (1) TABLET BY MOUTH EVERY DAY 90 tablet 3  . Multiple Vitamins-Minerals (MULTIVITAMIN WITH MINERALS) tablet Take 1 tablet by mouth daily.    .  cyclobenzaprine (FLEXERIL) 10 MG tablet Take 1 tablet by mouth 3 (three) times daily as needed.  2  . oseltamivir (TAMIFLU) 75 MG capsule Take 1 capsule (75 mg total) by mouth every 12 (twelve) hours. (Patient not taking: Reported on 12/19/2017) 10 capsule 0   No facility-administered medications prior to visit.     Review of Systems   Patient denies headache, fevers, malaise, unintentional weight loss, skin rash, eye pain, sinus congestion and sinus pain, sore throat, dysphagia,  hemoptysis , cough, dyspnea, wheezing, chest pain, palpitations, orthopnea, edema, abdominal pain, nausea, melena, diarrhea, constipation, flank pain, dysuria, hematuria, urinary  Frequency, nocturia, numbness, tingling, seizures,  Focal weakness, Loss of consciousness,  Tremor, insomnia, depression, anxiety, and suicidal ideation.     Objective:  BP 120/82 (BP Location: Left Arm, Patient Position: Sitting, Cuff Size: Normal)   Pulse 62   Temp 98.5 F (36.9 C) (Oral)   Resp 14   Ht 6\' 2"  (1.88 m)   Wt 270 lb (122.5 kg)   SpO2 97%   BMI 34.67 kg/m   Physical Exam   General appearance: alert, cooperative and appears stated age Ears: normal TM's and external ear canals both ears Throat: lips, mucosa, and tongue normal; teeth and gums normal Neck: no adenopathy, no carotid bruit, supple, symmetrical, trachea midline and thyroid not enlarged, symmetric, no tenderness/mass/nodules Back: symmetric, no curvature. ROM normal. No CVA tenderness. Lungs: clear to auscultation bilaterally Heart: regular rate and rhythm, S1, S2 normal, no murmur, click, rub or gallop Abdomen: soft, non-tender; bowel sounds normal; no masses,  no organomegaly Pulses: 2+ and symmetric Skin: Skin color, texture, turgor normal. No rashes or lesions Lymph nodes: Cervical, supraclavicular, and axillary nodes normal.    Assessment & Plan:   Problem List Items Addressed This Visit    Hyperlipidemia    10 yr risk is 15% .  Continue  atorvastatin . He has  no side effects and liver enzymes are due  No changes today   Lab Results  Component Value Date   CHOL 141 12/19/2017   HDL 33.00 (L) 12/19/2017   LDLCALC 88 12/19/2017   LDLDIRECT 98.0 06/03/2016   TRIG 97.0 12/19/2017   CHOLHDL 4 12/19/2017   Lab Results  Component Value Date   ALT 27 12/19/2017   AST 17 12/19/2017   ALKPHOS 72 12/19/2017   BILITOT 0.8 12/19/2017         Relevant Orders   Lipid panel (Completed)   Hypertension    Well controlled on current regimen. Renal function stable, no changes today.  Lab Results  Component Value Date   CREATININE 0.86 12/19/2017   Lab Results  Component Value Date   NA 142 12/19/2017   K 3.9 12/19/2017   CL 106 12/19/2017   CO2 26 12/19/2017         Relevant Orders   Comprehensive metabolic panel (Completed)   Obesity (BMI 30-39.9)    I have addressed  BMI and recommended wt loss of 10% of body weigh over the next 6 months using a low glycemic index diet and regular exercise with a goal of 5 days per week.        Routine general medical examination at  a health care facility    Annual comprehensive preventive exam was done as well as an evaluation and management of acute and chronic conditions .  During the course of the visit the patient was educated and counseled about appropriate screening and preventive services including :  diabetes screening, lipid analysis with projected  10 year  risk for CAD , nutrition counseling, prostate and colorectal cancer screening, and recommended immunizations.  Printed recommendations for health maintenance screenings was given.        Other Visit Diagnoses    Family history of polyps in the colon    -  Primary   Relevant Orders   Ambulatory referral to Gastroenterology   Prostate cancer screening       Relevant Orders   PSA (Completed)      I have discontinued Jaymes Graff. Scearce's oseltamivir. I am also having him start on doxycycline. Additionally, I am  having him maintain his multivitamin with minerals, cyclobenzaprine, atorvastatin, hydrochlorothiazide, and amLODipine-benazepril.  Meds ordered this encounter  Medications  . doxycycline (VIBRA-TABS) 100 MG tablet    Sig: Take 1 tablet (100 mg total) by mouth 2 (two) times daily. For infected tick bites    Dispense:  20 tablet    Refill:  0    Medications Discontinued During This Encounter  Medication Reason  . oseltamivir (TAMIFLU) 75 MG capsule Patient has not taken in last 30 days    Follow-up: No follow-ups on file.   [name], MD

## 2017-12-19 NOTE — Patient Instructions (Addendum)
CONGRATS ON THE WEIGHT LOSS!!  You need a real eye exam.  I recommend the place next to [facility]  colonoscopy referral has been made  I WANT YOU TO LOSE 24 lbs by next visit in march/April   Health Maintenance, Male A healthy lifestyle and preventive care is important for your health and wellness. Ask your health care provider about what schedule of regular examinations is right for you. What should I know about weight and diet? Eat a Healthy Diet  Eat plenty of vegetables, fruits, whole grains, low-fat dairy products, and lean protein.  Do not eat a lot of foods high in solid fats, added sugars, or salt.  Maintain a Healthy Weight Regular exercise can help you achieve or maintain a healthy weight. You should:  Do at least 150 minutes of exercise each week. The exercise should increase your heart rate and make you sweat (moderate-intensity exercise).  Do strength-training exercises at least twice a week.  Watch Your Levels of Cholesterol and Blood Lipids  Have your blood tested for lipids and cholesterol every 5 years starting at 56 years of age. If you are at high risk for heart disease, you should start having your blood tested when you are 56 years old. You may need to have your cholesterol levels checked more often if: ? Your lipid or cholesterol levels are high. ? You are older than 56 years of age. ? You are at high risk for heart disease.  What should I know about cancer screening? Many types of cancers can be detected early and may often be prevented. Lung Cancer  You should be screened every year for lung cancer if: ? You are a current smoker who has smoked for at least 30 years. ? You are a former smoker who has quit within the past 15 years.  Talk to your health care provider about your screening options, when you should start screening, and how often you should be screened.  Colorectal Cancer  Routine colorectal cancer screening usually begins at 56 years of  age and should be repeated every 5-10 years until you are 56 years old. You may need to be screened more often if early forms of precancerous polyps or small growths are found. Your health care provider may recommend screening at an earlier age if you have risk factors for colon cancer.  Your health care provider may recommend using home test kits to check for hidden blood in the stool.  A small camera at the end of a tube can be used to examine your colon (sigmoidoscopy or colonoscopy). This checks for the earliest forms of colorectal cancer.  Prostate and Testicular Cancer  Depending on your age and overall health, your health care provider may do certain tests to screen for prostate and testicular cancer.  Talk to your health care provider about any symptoms or concerns you have about testicular or prostate cancer.  Skin Cancer  Check your skin from head to toe regularly.  Tell your health care provider about any new moles or changes in moles, especially if: ? There is a change in a mole's size, shape, or color. ? You have a mole that is larger than a pencil eraser.  Always use sunscreen. Apply sunscreen liberally and repeat throughout the day.  Protect yourself by wearing long sleeves, pants, a wide-brimmed hat, and sunglasses when outside.  What should I know about heart disease, diabetes, and high blood pressure?  If you are 49-39 years of age, have your  blood pressure checked every 3-5 years. If you are 62 years of age or older, have your blood pressure checked every year. You should have your blood pressure measured twice-once when you are at a hospital or clinic, and once when you are not at a hospital or clinic. Record the average of the two measurements. To check your blood pressure when you are not at a hospital or clinic, you can use: ? An automated blood pressure machine at a pharmacy. ? A home blood pressure monitor.  Talk to your health care provider about your target  blood pressure.  If you are between 26-80 years old, ask your health care provider if you should take aspirin to prevent heart disease.  Have regular diabetes screenings by checking your fasting blood sugar level. ? If you are at a normal weight and have a low risk for diabetes, have this test once every three years after the age of 88. ? If you are overweight and have a high risk for diabetes, consider being tested at a younger age or more often.  A one-time screening for abdominal aortic aneurysm (AAA) by ultrasound is recommended for men aged 91-75 years who are current or former smokers. What should I know about preventing infection? Hepatitis B If you have a higher risk for hepatitis B, you should be screened for this virus. Talk with your health care provider to find out if you are at risk for hepatitis B infection. Hepatitis C Blood testing is recommended for:  Everyone born from [birth year] through 1964.  Anyone with known risk factors for hepatitis C.  Sexually Transmitted Diseases (STDs)  You should be screened each year for STDs including gonorrhea and chlamydia if: ? You are sexually active and are younger than 56 years of age. ? You are older than 56 years of age and your health care provider tells you that you are at risk for this type of infection. ? Your sexual activity has changed since you were last screened and you are at an increased risk for chlamydia or gonorrhea. Ask your health care provider if you are at risk.  Talk with your health care provider about whether you are at high risk of being infected with HIV. Your health care provider may recommend a prescription medicine to help prevent HIV infection.  What else can I do?  Schedule regular health, dental, and eye exams.  Stay current with your vaccines (immunizations).  Do not use any tobacco products, such as cigarettes, chewing tobacco, and e-cigarettes. If you need help quitting, ask your health care  provider.  Limit alcohol intake to no more than 2 drinks per day. One drink equals 12 ounces of beer, 5 ounces of wine, or 1 ounces of hard liquor.  Do not use street drugs.  Do not share needles.  Ask your health care provider for help if you need support or information about quitting drugs.  Tell your health care provider if you often feel depressed.  Tell your health care provider if you have ever been abused or do not feel safe at home. This information is not intended to replace advice given to you by your health care provider. Make sure you discuss any questions you have with your health care provider. Document Released: 09/21/2007 Document Revised: 11/22/2015 Document Reviewed: 12/27/2014 Elsevier Interactive Patient Education  [employer].

## 2017-12-21 NOTE — Assessment & Plan Note (Signed)
10 yr risk is 15% .  Continue atorvastatin . He has  no side effects and liver enzymes are due  No changes today   Lab Results  Component Value Date   CHOL 141 12/19/2017   HDL 33.00 (L) 12/19/2017   LDLCALC 88 12/19/2017   LDLDIRECT 98.0 06/03/2016   TRIG 97.0 12/19/2017   CHOLHDL 4 12/19/2017   Lab Results  Component Value Date   ALT 27 12/19/2017   AST 17 12/19/2017   ALKPHOS 72 12/19/2017   BILITOT 0.8 12/19/2017

## 2017-12-21 NOTE — Assessment & Plan Note (Signed)

## 2017-12-21 NOTE — Assessment & Plan Note (Signed)
Well controlled on current regimen. Renal function stable, no changes today.  Lab Results  Component Value Date   CREATININE 0.86 12/19/2017   Lab Results  Component Value Date   NA 142 12/19/2017   K 3.9 12/19/2017   CL 106 12/19/2017   CO2 26 12/19/2017

## 2017-12-21 NOTE — Assessment & Plan Note (Signed)
I have addressed  BMI and recommended wt loss of 10% of body weigh over the next 6 months using a low glycemic index diet and regular exercise with a goal of 5 days per week.

## 2018-01-07 MED ORDER — TRAZODONE HCL 100 MG PO TABS: 100.0000 mg | ORAL_TABLET | Freq: Every evening | ORAL | 1 refills | Status: DC | PRN

## 2018-04-09 ENCOUNTER — Other Ambulatory Visit: Payer: Self-pay | Admitting: Internal Medicine

## 2018-04-09 DIAGNOSIS — I10 Essential (primary) hypertension: Secondary | ICD-10-CM

## 2018-06-12 ENCOUNTER — Encounter: Payer: Self-pay | Admitting: *Deleted

## 2018-06-15 ENCOUNTER — Ambulatory Visit: Payer: BLUE CROSS/BLUE SHIELD | Admitting: Certified Registered"

## 2018-06-15 ENCOUNTER — Other Ambulatory Visit: Payer: Self-pay

## 2018-06-15 ENCOUNTER — Encounter
Admission: RE | Disposition: A | Discharge status: Home / Self Care | Payer: Self-pay | Source: Home / Self Care | Attending: Unknown Physician Specialty

## 2018-06-15 ENCOUNTER — Ambulatory Visit
Admission: RE | Admit: 2018-06-15 | Discharge: 2018-06-15 | Disposition: A | Discharge status: Home / Self Care | Payer: BLUE CROSS/BLUE SHIELD | Attending: Unknown Physician Specialty | Admitting: Unknown Physician Specialty

## 2018-06-15 ENCOUNTER — Encounter: Payer: Self-pay | Admitting: Student

## 2018-06-15 DIAGNOSIS — K5730 Diverticulosis of large intestine without perforation or abscess without bleeding: Secondary | ICD-10-CM | POA: Diagnosis not present

## 2018-06-15 DIAGNOSIS — K640 First degree hemorrhoids: Secondary | ICD-10-CM | POA: Diagnosis not present

## 2018-06-15 DIAGNOSIS — D123 Benign neoplasm of transverse colon: Secondary | ICD-10-CM | POA: Insufficient documentation

## 2018-06-15 DIAGNOSIS — I10 Essential (primary) hypertension: Secondary | ICD-10-CM | POA: Diagnosis not present

## 2018-06-15 DIAGNOSIS — Z8249 Family history of ischemic heart disease and other diseases of the circulatory system: Secondary | ICD-10-CM | POA: Diagnosis not present

## 2018-06-15 DIAGNOSIS — Z79899 Other long term (current) drug therapy: Secondary | ICD-10-CM | POA: Diagnosis not present

## 2018-06-15 DIAGNOSIS — E785 Hyperlipidemia, unspecified: Secondary | ICD-10-CM | POA: Diagnosis not present

## 2018-06-15 DIAGNOSIS — I872 Venous insufficiency (chronic) (peripheral): Secondary | ICD-10-CM | POA: Diagnosis not present

## 2018-06-15 DIAGNOSIS — Z1211 Encounter for screening for malignant neoplasm of colon: Secondary | ICD-10-CM | POA: Diagnosis not present

## 2018-06-15 DIAGNOSIS — Z8371 Family history of colonic polyps: Secondary | ICD-10-CM | POA: Insufficient documentation

## 2018-06-15 HISTORY — PX: COLONOSCOPY WITH PROPOFOL: SHX5780

## 2018-06-15 LAB — HM COLONOSCOPY

## 2018-06-15 SURGERY — COLONOSCOPY WITH PROPOFOL
Anesthesia: General

## 2018-06-15 MED ORDER — PROPOFOL 500 MG/50ML IV EMUL
INTRAVENOUS | Status: AC
  Filled 2018-06-15: qty 50

## 2018-06-15 MED ORDER — MIDAZOLAM HCL 2 MG/2ML IJ SOLN
INTRAMUSCULAR | Status: AC
  Filled 2018-06-15: qty 2

## 2018-06-15 MED ORDER — SODIUM CHLORIDE 0.9 % IV SOLN
INTRAVENOUS | Status: DC
  Administered 2018-06-15: 1000 mL via INTRAVENOUS

## 2018-06-15 MED ORDER — FENTANYL CITRATE (PF) 100 MCG/2ML IJ SOLN
INTRAMUSCULAR | Status: AC
  Filled 2018-06-15: qty 2

## 2018-06-15 MED ORDER — PROPOFOL 10 MG/ML IV BOLUS
INTRAVENOUS | Status: DC | PRN
  Administered 2018-06-15: 50 mg via INTRAVENOUS
  Administered 2018-06-15: 30 mg via INTRAVENOUS

## 2018-06-15 MED ORDER — FENTANYL CITRATE (PF) 100 MCG/2ML IJ SOLN
INTRAMUSCULAR | Status: DC | PRN
  Administered 2018-06-15 (×2): 25 ug via INTRAVENOUS

## 2018-06-15 MED ORDER — LIDOCAINE HCL (CARDIAC) PF 100 MG/5ML IV SOSY
PREFILLED_SYRINGE | INTRAVENOUS | Status: DC | PRN
  Administered 2018-06-15: 80 mg via INTRATRACHEAL

## 2018-06-15 MED ORDER — ONDANSETRON HCL 4 MG/2ML IJ SOLN
INTRAMUSCULAR | Status: DC | PRN
  Administered 2018-06-15: 4 mg via INTRAVENOUS

## 2018-06-15 MED ORDER — LIDOCAINE HCL (PF) 2 % IJ SOLN
INTRAMUSCULAR | Status: AC
  Filled 2018-06-15: qty 10

## 2018-06-15 MED ORDER — MIDAZOLAM HCL 2 MG/2ML IJ SOLN
INTRAMUSCULAR | Status: DC | PRN
  Administered 2018-06-15 (×2): 1 mg via INTRAVENOUS

## 2018-06-15 MED ORDER — ONDANSETRON HCL 4 MG/2ML IJ SOLN
INTRAMUSCULAR | Status: AC
  Filled 2018-06-15: qty 2

## 2018-06-15 MED ORDER — SODIUM CHLORIDE 0.9 % IV SOLN: INTRAVENOUS | Status: DC

## 2018-06-15 MED ORDER — PROPOFOL 500 MG/50ML IV EMUL
INTRAVENOUS | Status: DC | PRN
  Administered 2018-06-15: 100 ug/kg/min via INTRAVENOUS

## 2018-06-15 NOTE — Anesthesia Post-op Follow-up Note (Signed)
Anesthesia QCDR form completed.        

## 2018-06-15 NOTE — Anesthesia Preprocedure Evaluation (Addendum)
Anesthesia Evaluation  Patient identified by MRN, date of birth, ID band Patient awake    Reviewed: Allergy & Precautions, NPO status , Patient's Chart, lab work & pertinent test results  Airway Mallampati: II  TM Distance: >3 FB     Dental  (+) Chipped   Pulmonary neg pulmonary ROS,    Pulmonary exam normal        Cardiovascular hypertension, Normal cardiovascular exam     Neuro/Psych  Neuromuscular disease negative psych ROS   GI/Hepatic negative GI ROS, Neg liver ROS,   Endo/Other  negative endocrine ROS  Renal/GU negative Renal ROS  negative genitourinary   Musculoskeletal negative musculoskeletal ROS (+)   Abdominal Normal abdominal exam  (+)   Peds negative pediatric ROS (+)  Hematology negative hematology ROS (+)   Anesthesia Other Findings   Reproductive/Obstetrics                            Anesthesia Physical Anesthesia Plan  ASA: II  Anesthesia Plan: General   Post-op Pain Management:    Induction: Intravenous  PONV Risk Score and Plan: Propofol infusion  Airway Management Planned: Nasal Cannula  Additional Equipment:   Intra-op Plan:   Post-operative Plan:   Informed Consent: I have reviewed the patients History and Physical, chart, labs and discussed the procedure including the risks, benefits and alternatives for the proposed anesthesia with the patient or authorized representative who has indicated his/her understanding and acceptance.     Dental advisory given  Plan Discussed with: CRNA and Surgeon  Anesthesia Plan Comments:         Anesthesia Quick Evaluation

## 2018-06-15 NOTE — Anesthesia Postprocedure Evaluation (Signed)
Anesthesia Post Note  Patient: Joseph Dalton  Procedure(s) Performed: COLONOSCOPY WITH PROPOFOL (N/A )  Patient location during evaluation: Endoscopy Anesthesia Type: General Level of consciousness: awake and alert and oriented Pain management: pain level controlled Vital Signs Assessment: post-procedure vital signs reviewed and stable Cardiovascular status: blood pressure returned to baseline Anesthetic complications: no     Last Vitals:  Vitals:   06/15/18 1357 06/15/18 1407  BP: 122/84 119/86  Pulse:    Resp:    Temp:    SpO2:      Last Pain:  Vitals:   06/15/18 1417  TempSrc:   PainSc: 0-No pain                 Canary Fister

## 2018-06-15 NOTE — Transfer of Care (Signed)
Immediate Anesthesia Transfer of Care Note  Patient: Joseph Dalton  Procedure(s) Performed: COLONOSCOPY WITH PROPOFOL (N/A )  Patient Location: Endoscopy Unit  Anesthesia Type:General  Level of Consciousness: drowsy  Airway & Oxygen Therapy: Patient Spontanous Breathing and Patient connected to nasal cannula oxygen  Post-op Assessment: Report given to RN and Post -op Vital signs reviewed and stable  Post vital signs: stable  Last Vitals:  Vitals Value Taken Time  BP 110/77 06/15/2018  1:48 PM  Temp 36.1 C 06/15/2018  1:47 PM  Pulse 75 06/15/2018  1:50 PM  Resp 16 06/15/2018  1:50 PM  SpO2 100 % 06/15/2018  1:50 PM  Vitals shown include unvalidated device data.  Last Pain:  Vitals:   06/15/18 1347  TempSrc: Tympanic  PainSc: Asleep         Complications: No apparent anesthesia complications

## 2018-06-15 NOTE — Op Note (Signed)
[facility] Patient Name: Joseph Dalton Procedure Date: 06/15/2018 12:48 PM MRN: [id number] Account #: [id number] Date of Birth: 09-26-1960 Admit Type: Outpatient Age: 58 Room: [facility] ENDO ROOM 1 Gender: Male Note Status: Finalized Procedure:            Colonoscopy Indications:          Colon cancer screening in patient at increased risk:                        Family history of 1st-degree relative with colon polyps Providers:            Manya Silvas, MD Referring MD:         Deborra Medina, MD (Referring MD) Medicines:            Propofol per Anesthesia Complications:        No immediate complications. Procedure:            Pre-Anesthesia Assessment:                       - After reviewing the risks and benefits, the patient                        was deemed in satisfactory condition to undergo the                        procedure.                       After obtaining informed consent, the colonoscope was                        passed under direct vision. Throughout the procedure,                        the patient's blood pressure, pulse, and oxygen                        saturations were monitored continuously. The was                        introduced through the anus and advanced to the the                        cecum, identified by appendiceal orifice and ileocecal                        valve. The colonoscopy was performed without                        difficulty. The patient tolerated the procedure well.                        The quality of the bowel preparation was good. Findings:      A small polyp was found in the ascending colon. The polyp was sessile.       The polyp was removed with a hot snare. Resection and retrieval were       complete.      Internal hemorrhoids were found during endoscopy. The hemorrhoids were       small and Grade I (internal hemorrhoids that do not  prolapse).      A few small-mouthed diverticula were found  in the sigmoid colon.      The exam was otherwise without abnormality. Impression:           - One small polyp in the ascending colon, removed with                        a hot snare. Resected and retrieved.                       - Internal hemorrhoids. Recommendation:       - Await pathology results. Manya Silvas, MD 06/15/2018 1:47:43 PM This report has been signed electronically. Number of Addenda: 0 Note Initiated On: 06/15/2018 12:48 PM Scope Withdrawal Time: 0 hours 19 minutes 43 seconds  Total Procedure Duration: 0 hours 25 minutes 20 seconds       [facility]

## 2018-06-15 NOTE — H&P (Signed)
Primary Care Physician:  [name], MD Primary Gastroenterologist:  Dr. Vira Agar  Pre-Procedure History & Physical: HPI:  Joseph Dalton is a 58 y.o. male is here for an colonoscopy.   Past Medical History:  Diagnosis Date  . HTN (hypertension)   . Hyperlipemia   . Venous insufficiency     Past Surgical History:  Procedure Laterality Date  . NO PAST SURGERIES      Prior to Admission medications   Medication Sig Start Date End Date Taking? Authorizing Provider  amLODipine-benazepril (LOTREL) 5-40 MG capsule TAKE (1) CAPSULE BY MOUTH EVERY DAY 10/13/17  Yes [name], MD  atorvastatin (LIPITOR) 20 MG tablet TAKE (1) TABLET BY MOUTH DAILY AT BEDTIME 04/09/18  Yes [name], MD  cyclobenzaprine (FLEXERIL) 10 MG tablet Take 1 tablet by mouth 3 (three) times daily as needed. 05/14/16  Yes [provider]  hydrochlorothiazide (HYDRODIURIL) 25 MG tablet TAKE (1) TABLET BY MOUTH EVERY DAY 04/09/18  Yes [name], MD  Multiple Vitamins-Minerals (MULTIVITAMIN WITH MINERALS) tablet Take 1 tablet by mouth daily.   Yes [provider]  traZODone (DESYREL) 100 MG tablet Take 1 tablet (100 mg total) by mouth at bedtime as needed for sleep. 01/07/18  Yes [name], MD  doxycycline (VIBRA-TABS) 100 MG tablet Take 1 tablet (100 mg total) by mouth 2 (two) times daily. For infected tick bites Patient not taking: Reported on 06/15/2018 12/19/17   [name], MD    Allergies as of 02/19/2018  . (No Known Allergies)    Family History  Problem Relation Age of Onset  . Stroke Mother   . Hyperlipidemia Mother   . Heart disease Father 25       heart attack    Social History   Socioeconomic History  . Marital status: Married    Spouse name: Not on file  . Number of children: 1  . Years of education: Not on file  . Highest education level: Not on file  Occupational History  . Occupation: Self Employed - Teaching laboratory technician  Social Needs  . Financial  resource strain: Not on file  . Food insecurity:    Worry: Not on file    Inability: Not on file  . Transportation needs:    Medical: Not on file    Non-medical: Not on file  Tobacco Use  . Smoking status: Never Smoker  . Smokeless tobacco: Current User    Types: Chew  Substance and Sexual Activity  . Alcohol use: Yes    Alcohol/week: 1.0 standard drinks    Types: 1 Cans of beer per week    Comment: Occasional  . Drug use: No  . Sexual activity: Not on file  Lifestyle  . Physical activity:    Days per week: Not on file    Minutes per session: Not on file  . Stress: Not on file  Relationships  . Social connections:    Talks on phone: Not on file    Gets together: Not on file    Attends religious service: Not on file    Active member of club or organization: Not on file    Attends meetings of clubs or organizations: Not on file    Relationship status: Not on file  . Intimate partner violence:    Fear of current or ex partner: Not on file    Emotionally abused: Not on file    Physically abused: Not on file  Forced sexual activity: Not on file  Other Topics Concern  . Not on file  Social History Narrative   Lives in [place] with wife, 1 daughter. Dog.  Works as Psychiatric nurse.       Regular Exercise -  NO   Daily Caffeine Use:  1 cup coffee and 1 soda a day    Review of Systems: See HPI, otherwise negative ROS  Physical Exam: BP 119/88   Pulse 63   Temp 97.6 F (36.4 C) (Tympanic)   Resp 18   Ht 6\' 2"  (1.88 m)   Wt 115.7 kg   SpO2 98%   BMI 32.74 kg/m  General:   Alert,  pleasant and cooperative in NAD Head:  Normocephalic and atraumatic. Neck:  Supple; no masses or thyromegaly. Lungs:  Clear throughout to auscultation.    Heart:  Regular rate and rhythm. Abdomen:  Soft, nontender and nondistended. Normal bowel sounds, without guarding, and without rebound.   Neurologic:  Alert and  oriented x4;  grossly normal  neurologically.  Impression/Plan: Joseph Dalton is here for an colonoscopy to be performed for FH colon polyps in mother.  Risks, benefits, limitations, and alternatives regarding  colonoscopy have been reviewed with the patient.  Questions have been answered.  All parties agreeable.   Gaylyn Cheers, MD  06/15/2018, 1:06 PM

## 2018-06-16 LAB — HM COLONOSCOPY

## 2018-06-17 ENCOUNTER — Encounter: Payer: Self-pay | Admitting: Unknown Physician Specialty

## 2018-06-17 LAB — SURGICAL PATHOLOGY

## 2018-06-19 ENCOUNTER — Encounter: Payer: Self-pay | Admitting: Internal Medicine

## 2018-06-19 ENCOUNTER — Other Ambulatory Visit: Payer: Self-pay | Admitting: Internal Medicine

## 2018-06-19 ENCOUNTER — Ambulatory Visit: Payer: BLUE CROSS/BLUE SHIELD | Admitting: Internal Medicine

## 2018-06-19 ENCOUNTER — Other Ambulatory Visit: Payer: Self-pay

## 2018-06-19 VITALS — BP 126/74 | HR 73 | Temp 98.1°F | Resp 15 | Ht 72.0 in | Wt 278.4 lb

## 2018-06-19 DIAGNOSIS — M5386 Other specified dorsopathies, lumbar region: Secondary | ICD-10-CM

## 2018-06-19 DIAGNOSIS — E782 Mixed hyperlipidemia: Secondary | ICD-10-CM

## 2018-06-19 DIAGNOSIS — E669 Obesity, unspecified: Secondary | ICD-10-CM | POA: Diagnosis not present

## 2018-06-19 DIAGNOSIS — R7301 Impaired fasting glucose: Secondary | ICD-10-CM | POA: Diagnosis not present

## 2018-06-19 DIAGNOSIS — I10 Essential (primary) hypertension: Secondary | ICD-10-CM | POA: Diagnosis not present

## 2018-06-19 DIAGNOSIS — Z20828 Contact with and (suspected) exposure to other viral communicable diseases: Secondary | ICD-10-CM

## 2018-06-19 LAB — COMPREHENSIVE METABOLIC PANEL
ALT: 27 U/L (ref 0–53)
AST: 23 U/L (ref 0–37)
Albumin: 4.2 g/dL (ref 3.5–5.2)
Alkaline Phosphatase: 87 U/L (ref 39–117)
BUN: 20 mg/dL (ref 6–23)
CO2: 29 mEq/L (ref 19–32)
Calcium: 9.1 mg/dL (ref 8.4–10.5)
Chloride: 104 mEq/L (ref 96–112)
Creatinine, Ser: 0.97 mg/dL (ref 0.40–1.50)
GFR: 79.96 mL/min (ref >60.00)
Glucose, Bld: 109 mg/dL — ABNORMAL HIGH (ref 70–99)
Potassium: 3.6 mEq/L (ref 3.5–5.1)
Sodium: 140 mEq/L (ref 135–145)
Total Bilirubin: 0.8 mg/dL (ref 0.2–1.2)
Total Protein: 6.8 g/dL (ref 6.0–8.3)

## 2018-06-19 LAB — HEMOGLOBIN A1C: Hgb A1c MFr Bld: 5.3 % (ref 4.6–6.5)

## 2018-06-19 MED ORDER — AMLODIPINE BESYLATE 10 MG PO TABS: 10.0000 mg | ORAL_TABLET | Freq: Every day | ORAL | 3 refills | Status: DC

## 2018-06-19 MED ORDER — OSELTAMIVIR PHOSPHATE 75 MG PO CAPS: 75.0000 mg | ORAL_CAPSULE | Freq: Every day | ORAL | 0 refills | Status: DC

## 2018-06-19 NOTE — Patient Instructions (Addendum)
Your blood pressure is well controlled on your current regimen,  HOWEVER:     I am making a decision to change your blood pressure medication and eliminate the benazepril , based on increased reports by several of my ENT colleagues of patients  developing tongue and throat swelling from this class of drugs.  The condition , called "angioedema," can be fatal if a person's airway is compromised.  I  Am increasing the dose of amlodipine to compensate and I also want you to take it at night instead of morning,  as recent studies have shown that taking your blood pressure medications at night protects you better from heart attacks and strokes.   Continue taking hctz in the morning    See you in 6 months

## 2018-06-19 NOTE — Progress Notes (Signed)
Subjective:  Patient ID: Joseph Dalton, male    DOB: 1960/04/30  Age: 58 y.o. MRN: [id number]  CC: The primary encounter diagnosis was Essential hypertension. Diagnoses of Impaired fasting glucose, Sciatica of left side associated with disorder of lumbar spine, Obesity (BMI 30-39.9), Exposure to influenza, and Mixed hyperlipidemia were also pertinent to this visit.  HPI Joseph Dalton presents for follow up on chronic conditions including  Hypertension and hyperlipidemia .  Marland KitchenPatient feels fine and has no new complaints.  He is taking his medications as prescribed and notes no adverse effects.  Home BP readings have been done about once per month  and are  generally < 130/80 .  He is not avoiding added salt in his  Or exercising regularly as he has a physically strenuous job and chronic left sided sciatica that has been managed  non surgically following evaluation  by Neurosurgeon in 2018 .  Outpatient Medications Prior to Visit  Medication Sig Dispense Refill  . atorvastatin (LIPITOR) 20 MG tablet TAKE (1) TABLET BY MOUTH DAILY AT BEDTIME 90 tablet 3  . cyclobenzaprine (FLEXERIL) 10 MG tablet Take 1 tablet by mouth 3 (three) times daily as needed.  2  . doxycycline (VIBRA-TABS) 100 MG tablet Take 1 tablet (100 mg total) by mouth 2 (two) times daily. For infected tick bites 20 tablet 0  . hydrochlorothiazide (HYDRODIURIL) 25 MG tablet TAKE (1) TABLET BY MOUTH EVERY DAY 90 tablet 3  . Multiple Vitamins-Minerals (MULTIVITAMIN WITH MINERALS) tablet Take 1 tablet by mouth daily.    . traZODone (DESYREL) 100 MG tablet Take 1 tablet (100 mg total) by mouth at bedtime as needed for sleep. 90 tablet 1  . amLODipine-benazepril (LOTREL) 5-40 MG capsule TAKE (1) CAPSULE BY MOUTH EVERY DAY 90 capsule 1   No facility-administered medications prior to visit.     Review of Systems;  Patient denies headache, fevers, malaise, unintentional weight loss, skin rash, eye pain, sinus congestion and sinus pain,  sore throat, dysphagia,  hemoptysis , cough, dyspnea, wheezing, chest pain, palpitations, orthopnea, edema, abdominal pain, nausea, melena, diarrhea, constipation, flank pain, dysuria, hematuria, urinary  Frequency, nocturia, numbness, tingling, seizures,  Focal weakness, Loss of consciousness,  Tremor, insomnia, depression, anxiety, and suicidal ideation.      Objective:  BP 126/74 (BP Location: Left Arm, Patient Position: Sitting, Cuff Size: Normal)   Pulse 73   Temp 98.1 F (36.7 C) (Oral)   Resp 15   Ht 6' (1.829 m)   Wt 278 lb 6.4 oz (126.3 kg)   SpO2 92%   BMI 37.76 kg/m   BP Readings from Last 3 Encounters:  06/19/18 126/74  06/15/18 119/86  12/19/17 120/82    Wt Readings from Last 3 Encounters:  06/19/18 278 lb 6.4 oz (126.3 kg)  06/15/18 255 lb (115.7 kg)  12/19/17 270 lb (122.5 kg)    General appearance: alert, cooperative and appears stated age Ears: normal TM's and external ear canals both ears Throat: lips, mucosa, and tongue normal; teeth and gums normal Neck: no adenopathy, no carotid bruit, supple, symmetrical, trachea midline and thyroid not enlarged, symmetric, no tenderness/mass/nodules Back: symmetric, no curvature. ROM normal. No CVA tenderness. Lungs: clear to auscultation bilaterally Heart: regular rate and rhythm, S1, S2 normal, no murmur, click, rub or gallop Abdomen: soft, non-tender; bowel sounds normal; no masses,  no organomegaly Pulses: 2+ and symmetric Skin: Skin color, texture, turgor normal. No rashes or lesions Lymph nodes: Cervical, supraclavicular, and axillary nodes normal.  Lab Results  Component Value Date   HGBA1C 5.3 06/19/2018   HGBA1C 5.4 06/11/2017   HGBA1C 5.4 06/03/2016    Lab Results  Component Value Date   CREATININE 0.97 06/19/2018   CREATININE 0.86 12/19/2017   CREATININE 0.89 06/11/2017    Lab Results  Component Value Date   WBC 6.6 10/05/2015   HGB 15.9 10/05/2015   HCT 46.4 10/05/2015   PLT 191.0  10/05/2015   GLUCOSE 109 (H) 06/19/2018   CHOL 141 12/19/2017   TRIG 97.0 12/19/2017   HDL 33.00 (L) 12/19/2017   LDLDIRECT 98.0 06/03/2016   LDLCALC 88 12/19/2017   ALT 27 06/19/2018   AST 23 06/19/2018   NA 140 06/19/2018   K 3.6 06/19/2018   CL 104 06/19/2018   CREATININE 0.97 06/19/2018   BUN 20 06/19/2018   CO2 29 06/19/2018   TSH 0.75 11/22/2013   PSA 1.10 12/19/2017   HGBA1C 5.3 06/19/2018   MICROALBUR <0.7 06/11/2017    No results found.  Assessment & Plan:   Problem List Items Addressed This Visit    Sciatica of left side associated with disorder of lumbar spine    He has been evaluated by Jovita Gamma following MRI results,  No surgery is advised.  Recommend weight loss and core strengthening exercise.       Obesity (BMI 30-39.9)     I have addressed  BMI and recommended a low glycemic index diet regular participation in aerobic exercise with a goal of 30 minutes of aerobic exercise a minimum of 5 days per week.  He is not prediabetic by screening today       Hypertension - Primary    I am making a decision to discontinue  patient's ACE Inhibitor , based on increased anecdotal reports of life threateneng angioedema with ACE Inhibitors .  I also advised patient to take10 mg amlodipine at night instead of morning,  as recent studies have shown a favorable effect on CAD and CVA rates in those who do       Relevant Medications   amLODipine (NORVASC) 10 MG tablet   Other Relevant Orders   Comprehensive metabolic panel (Completed)   Hyperlipidemia    10 yr risk is 15% .  Continue atorvastatin . He has  no side effects and liver enzymes are due  No changes today   Lab Results  Component Value Date   CHOL 141 12/19/2017   HDL 33.00 (L) 12/19/2017   LDLCALC 88 12/19/2017   LDLDIRECT 98.0 06/03/2016   TRIG 97.0 12/19/2017   CHOLHDL 4 12/19/2017   Lab Results  Component Value Date   ALT 27 06/19/2018   AST 23 06/19/2018   ALKPHOS 87 06/19/2018   BILITOT  0.8 06/19/2018         Relevant Medications   amLODipine (NORVASC) 10 MG tablet   Exposure to influenza    Patient was roomed by a CMA who tested positive for Influenza A same day.  He was called immediately and advised to start Tamiflu prophylactically        Other Visit Diagnoses    Impaired fasting glucose       Relevant Orders   Hemoglobin A1c (Completed)     A total of 25 minutes of face to face time was spent with patient more than half of which was spent in counselling about the above mentioned conditions  and coordination of care  I have discontinued Kelby L. Rothlisberger's amLODipine-benazepril. I am also having  him start on amLODipine. Additionally, I am having him maintain his multivitamin with minerals, cyclobenzaprine, doxycycline, traZODone, atorvastatin, and hydrochlorothiazide.  Meds ordered this encounter  Medications  . amLODipine (NORVASC) 10 MG tablet    Sig: Take 1 tablet (10 mg total) by mouth at bedtime.    Dispense:  90 tablet    Refill:  3    Medications Discontinued During This Encounter  Medication Reason  . amLODipine-benazepril (LOTREL) 5-40 MG capsule     Follow-up: Return in about 6 months (around 12/20/2018) for CPE.   [name], MD

## 2018-06-21 NOTE — Assessment & Plan Note (Signed)
Patient was roomed by a CMA who tested positive for Influenza A same day.  He was called immediately and advised to start Tamiflu prophylactically

## 2018-06-21 NOTE — Assessment & Plan Note (Signed)
10 yr risk is 15% .  Continue atorvastatin . He has  no side effects and liver enzymes are due  No changes today   Lab Results  Component Value Date   CHOL 141 12/19/2017   HDL 33.00 (L) 12/19/2017   LDLCALC 88 12/19/2017   LDLDIRECT 98.0 06/03/2016   TRIG 97.0 12/19/2017   CHOLHDL 4 12/19/2017   Lab Results  Component Value Date   ALT 27 06/19/2018   AST 23 06/19/2018   ALKPHOS 87 06/19/2018   BILITOT 0.8 06/19/2018

## 2018-06-21 NOTE — Assessment & Plan Note (Addendum)
I have addressed  BMI and recommended a low glycemic index diet regular participation in aerobic exercise with a goal of 30 minutes of aerobic exercise a minimum of 5 days per week.  He is not prediabetic by screening today

## 2018-06-21 NOTE — Assessment & Plan Note (Signed)
He has been evaluated by Jovita Gamma following MRI results,  No surgery is advised.  Recommend weight loss and core strengthening exercise.

## 2018-06-21 NOTE — Assessment & Plan Note (Signed)
I am making a decision to discontinue  patient's ACE Inhibitor , based on increased anecdotal reports of life threateneng angioedema with ACE Inhibitors .  I also advised patient to take10 mg amlodipine at night instead of morning,  as recent studies have shown a favorable effect on CAD and CVA rates in those who do

## 2018-08-10 ENCOUNTER — Encounter: Payer: Self-pay | Admitting: Family

## 2018-08-10 ENCOUNTER — Ambulatory Visit (INDEPENDENT_AMBULATORY_CARE_PROVIDER_SITE_OTHER): Payer: BLUE CROSS/BLUE SHIELD | Admitting: Family

## 2018-08-10 DIAGNOSIS — M5386 Other specified dorsopathies, lumbar region: Secondary | ICD-10-CM | POA: Diagnosis not present

## 2018-08-10 MED ORDER — PREDNISONE 10 MG PO TABS: ORAL_TABLET | ORAL | 0 refills | Status: DC

## 2018-08-10 NOTE — Progress Notes (Signed)
This visit type was conducted due to national recommendations for restrictions regarding the COVID-19 pandemic (e.g. social distancing).  This format is felt to be most appropriate for this patient at this time.  All issues noted in this document were discussed and addressed.  No physical exam was performed (except for noted visual exam findings with Video Visits).  Virtual Visit via Video Note  I connected with@  on 08/10/18 at 11:30 AM EDT by a video enabled telemedicine application and verified that I am speaking with the correct person using two identifiers.  Location patient: home Location provider:work Persons participating in the virtual visit: patient, provider  I discussed the limitations of evaluation and management by telemedicine and the availability of in person appointments. The patient expressed understanding and agreed to proceed.   HPI:  CC: right low sided back pain onset x 6 days, unchanged. Accompanied by wife.  Not migratory. No numbness or weakness in legs.  'Some' pain in groin which is normal for him, not new. NO numbness in groin.  Been home for 5 days due to pain.   NO h/o cancer. Last BM 2 days ago. Feels more constipation since starting Lodine.   Able to urinate normally. Started taking dulcolax PO today. Drinking 'some' water. Passing gas. No abdominal  pain, fever, nausea, vomiting, hematuria . No h/o kidney stone.    Rested the past few days with no relief.  Tried flexeril, ibuprofen, BioFreeze, ice, Lodine 500mg  BID without relief. Follows with Dr Sherwood Gambler, neurosurgery.   Beer once in while. No drug use.  No h/o seizure.   Works in [employer] however doesn't report injury when pain started. Walking around site that day, bending over or getting on equipment.     h/o of low back pain.  MRI lumbar 2017 .  ROS: See pertinent positives and negatives per HPI.  Past Medical History:  Diagnosis Date  . HTN (hypertension)   . Hyperlipemia   . Venous  insufficiency     Past Surgical History:  Procedure Laterality Date  . COLONOSCOPY WITH PROPOFOL N/A 06/15/2018   Procedure: COLONOSCOPY WITH PROPOFOL;  Surgeon: Manya Silvas, MD;  Location: [facility] ENDOSCOPY;  Service: Endoscopy;  Laterality: N/A;  . NO PAST SURGERIES      Family History  Problem Relation Age of Onset  . Stroke Mother   . Hyperlipidemia Mother   . Heart disease Father 16       heart attack    SOCIAL HX: never smoker   Current Outpatient Medications:  .  amLODipine (NORVASC) 10 MG tablet, Take 1 tablet (10 mg total) by mouth at bedtime., Disp: 90 tablet, Rfl: 3 .  atorvastatin (LIPITOR) 20 MG tablet, TAKE (1) TABLET BY MOUTH DAILY AT BEDTIME, Disp: 90 tablet, Rfl: 3 .  cyclobenzaprine (FLEXERIL) 10 MG tablet, Take 1 tablet by mouth 3 (three) times daily as needed., Disp: , Rfl: 2 .  etodolac (LODINE) 500 MG tablet, Take 500 mg by mouth 2 (two) times daily., Disp: , Rfl:  .  hydrochlorothiazide (HYDRODIURIL) 25 MG tablet, TAKE (1) TABLET BY MOUTH EVERY DAY, Disp: 90 tablet, Rfl: 3 .  Multiple Vitamins-Minerals (MULTIVITAMIN WITH MINERALS) tablet, Take 1 tablet by mouth daily., Disp: , Rfl:  .  traZODone (DESYREL) 100 MG tablet, Take 1 tablet (100 mg total) by mouth at bedtime as needed for sleep., Disp: 90 tablet, Rfl: 1 .  predniSONE (DELTASONE) 10 MG tablet, Take 40 mg by mouth on day 1, then taper 10 mg  daily until gone, Disp: 10 tablet, Rfl: 0  EXAM:  VITALS per patient if applicable:  GENERAL: alert, oriented, appears well and in no acute distress  HEENT: atraumatic, conjunttiva clear, no obvious abnormalities on inspection of external nose and ears  NECK: normal movements of the head and neck  LUNGS: on inspection no signs of respiratory distress, breathing rate appears normal, no obvious gross SOB, gasping or wheezing  CV: no obvious cyanosis  MS: moves all visible extremities without noticeable abnormality  PSYCH/NEURO: pleasant and cooperative,  no obvious depression or anxiety, speech and thought processing grossly intact  ASSESSMENT AND PLAN:  Discussed the following assessment and plan:  Sciatica of left side associated with disorder of lumbar spine - Plan: predniSONE (DELTASONE) 10 MG tablet  Problem List Items Addressed This Visit      Nervous and Auditory   Sciatica of left side associated with disorder of lumbar spine - Primary    Acute on chronic. No alarm symptoms today.  Patient has associated constipation , suspect related to lack of activity; given information regards to this.  Discussed with patient the likely he would benefit from prednisone based on presentation today.  He understands risk specifically with novel COVID 19 virus.  Education about on how to take prednisone as well as side effects to look out for. He declines trial of tramadol instead of prednisone and would feel more comfortable with prednisone.  Advised patient to stay home while on prednisone, especially in setting the pandemic.  He verbalized understanding of all.  Close follow-up and patient will let me know if he experiences new or worsening symptoms prior to follow-up.      Relevant Medications   predniSONE (DELTASONE) 10 MG tablet        I discussed the assessment and treatment plan with the patient. The patient was provided an opportunity to ask questions and all were answered. The patient agreed with the plan and demonstrated an understanding of the instructions.   The patient was advised to call back or seek an in-person evaluation if the symptoms worsen or if the condition fails to improve as anticipated.   Mable Paris, FNP

## 2018-08-10 NOTE — Assessment & Plan Note (Signed)
Acute on chronic. No alarm symptoms today.  Patient has associated constipation , suspect related to lack of activity; given information regards to this.  Discussed with patient the likely he would benefit from prednisone based on presentation today.  He understands risk specifically with novel COVID 19 virus.  Education about on how to take prednisone as well as side effects to look out for. He declines trial of tramadol instead of prednisone and would feel more comfortable with prednisone.  Advised patient to stay home while on prednisone, especially in setting the pandemic.  He verbalized understanding of all.  Close follow-up and patient will let me know if he experiences new or worsening symptoms prior to follow-up.

## 2018-08-10 NOTE — Patient Instructions (Addendum)
As discussed today, I think you would benefit from prednisone taper however we discussed at length the potential concerns during the pandemic with this potentially posing threat to your ability to fight COVID-19 shall you contract.  Please stay at home for the next 5 days at least, maintainclose vigilance,  Please take all  steroid doses by  mid afternoon as it can interfere with your sleep.  Please stay very vigilant about your symptoms taper with limitation telemedicine today.  I will look forward to seeing you at follow-up.  Please let me know you have worsening symptoms prior to follow up.   Constipation plan  1) Take Miralax once to twice per day until you have a bowel movement. You will end up titrating the use of Miralax. For example, you  may find that using the medication every other day or three times a week is a good bowel regimen for you. Or perhaps, twice weekly.   2)  If you do not get results with Miralax alone, you may then add Bisacodyl suppository daily to regimen until you get desired bowel results.  3) Take Colace ( stool softener) twice daily every day.   It is MOST important to drink LOTS of water and follow a HIGH fiber diet to keep foods moving through the gut. You may add Metamucil to a beverage that you drink.  Information on prevention of constipation as well as acute treatment for constipation as included below.  If there is no improvement in your symptoms, or if there is any worsening of symptoms, or if you have any additional concerns, please return to this clinic for re-evaluation; or, if we are closed, consider going to the Emergency Room for evaluation.    Constipation Prevention What is Constipation? Constipation is hard, dry bowel movements or the inability to have a bowel movement.  You can also feel like you need to have a bowel movement but not be able to.  It can also be painful when you strain to have a bowel movement.  Taking narcotic pain medicine  after surgery can make you constipated, even if you have never had a problem with constipation. What Do I Need To Do? The best thing to do for constipation is to keep it from happening.  This can be done by: Adding laxatives to your daily routine, when taking prescription pain medicines after surgery. Add 17 gm Miralax daily or 100 mg Colace once or twice daily. (Miralax is mixed in water. Colace is a pill). They soften your bowel movements to make them easier to pass and hurt less. Drink plenty of water to help flush your bowels.  (Eight, 8 ounce glasses daily) Eat foods high in fiber such as whole grains, vegetables, cereals, fruits, and prune juice (5-7 servings a day or 25 grams).  If you do not know how much fiber a food has in it you can look on the label under dietary fiber.  If you have trouble getting enough fiber in your diet you may want to consider a fiber supplement such as Metamucil or Citrucel.  Also, be aware that eating fiber without drinking enough water can make constipation worse. If you do become constipated some medications that may help are: Bisacodyl (Dulcolax) is available in tablet form or a suppository. Glycerin suppositories are also a good choice if you need a fast acting medication. Everybody is different and may have different results.  Talk to your pharmacist or health care provider about your specific problems. They  can help you choose the best product for you.  Why Is It Important for Me To Do This? Being constipated is not something you have to live with.  There are many things you can do to help.   Feeling bad can interfere with your recovery after surgery.  If constipation goes on for too long it can become a very serious medical problem. You may need to visit your doctor or go to the hospital.  That is why it is very important to drink lots of water, eat enough fiber, and keep it from happening.  Ask Questions We want to answer all of your questions and concerns.   Thats why we encourage you to use a program called Ask Me 3, created by the [employer].  By using Ask Me 3 you are encouraged to ask 3 simple (yet, potentially life saving questions) whenever you are talking with your physician, nurse or pharmacist: What is my main problem? What do I need to do? Why is it important for me to do this? By understanding the answer to these three questions and any other questions you may have, you have the knowledge necessary to manage your health. Please feel very comfortable asking any questions. Healthcare is complicated, so if you hear an answer you do not understand, please ask your health care team to explain again.   Sources: [employer] 01-25-10   N

## 2018-08-11 ENCOUNTER — Ambulatory Visit: Payer: BLUE CROSS/BLUE SHIELD | Admitting: Internal Medicine

## 2018-08-17 ENCOUNTER — Ambulatory Visit: Payer: BLUE CROSS/BLUE SHIELD | Admitting: Family

## 2018-08-17 NOTE — Progress Notes (Deleted)
This visit type was conducted due to national recommendations for restrictions regarding the COVID-19 pandemic (e.g. social distancing).  This format is felt to be most appropriate for this patient at this time.  All issues noted in this document were discussed and addressed.  No physical exam was performed (except for noted visual exam findings with Video Visits). Virtual Visit via Video Note  I connected with@  on 08/17/18 at  9:30 AM EDT by a video enabled telemedicine application and verified that I am speaking with the correct person using two identifiers.  Location patient: home Location provider:work or home office Persons participating in the virtual visit: patient, provider  I discussed the limitations of evaluation and management by telemedicine and the availability of in person appointments. The patient expressed understanding and agreed to proceed.   HPI:  Left sciatica- started on prednisone 08/10/18  Constipation-   ROS: See pertinent positives and negatives per HPI.  Past Medical History:  Diagnosis Date  . HTN (hypertension)   . Hyperlipemia   . Venous insufficiency     Past Surgical History:  Procedure Laterality Date  . COLONOSCOPY WITH PROPOFOL N/A 06/15/2018   Procedure: COLONOSCOPY WITH PROPOFOL;  Surgeon: Manya Silvas, MD;  Location: [facility] ENDOSCOPY;  Service: Endoscopy;  Laterality: N/A;  . NO PAST SURGERIES      Family History  Problem Relation Age of Onset  . Stroke Mother   . Hyperlipidemia Mother   . Heart disease Father 16       heart attack    SOCIAL HX: ***   Current Outpatient Medications:  .  amLODipine (NORVASC) 10 MG tablet, Take 1 tablet (10 mg total) by mouth at bedtime., Disp: 90 tablet, Rfl: 3 .  atorvastatin (LIPITOR) 20 MG tablet, TAKE (1) TABLET BY MOUTH DAILY AT BEDTIME, Disp: 90 tablet, Rfl: 3 .  cyclobenzaprine (FLEXERIL) 10 MG tablet, Take 1 tablet by mouth 3 (three) times daily as needed., Disp: , Rfl: 2 .  etodolac  (LODINE) 500 MG tablet, Take 500 mg by mouth 2 (two) times daily., Disp: , Rfl:  .  hydrochlorothiazide (HYDRODIURIL) 25 MG tablet, TAKE (1) TABLET BY MOUTH EVERY DAY, Disp: 90 tablet, Rfl: 3 .  Multiple Vitamins-Minerals (MULTIVITAMIN WITH MINERALS) tablet, Take 1 tablet by mouth daily., Disp: , Rfl:  .  predniSONE (DELTASONE) 10 MG tablet, Take 40 mg by mouth on day 1, then taper 10 mg daily until gone, Disp: 10 tablet, Rfl: 0 .  traZODone (DESYREL) 100 MG tablet, Take 1 tablet (100 mg total) by mouth at bedtime as needed for sleep., Disp: 90 tablet, Rfl: 1  EXAM:  VITALS per patient if applicable:  GENERAL: alert, oriented, appears well and in no acute distress  HEENT: atraumatic, conjunttiva clear, no obvious abnormalities on inspection of external nose and ears  NECK: normal movements of the head and neck  LUNGS: on inspection no signs of respiratory distress, breathing rate appears normal, no obvious gross SOB, gasping or wheezing  CV: no obvious cyanosis  MS: moves all visible extremities without noticeable abnormality  PSYCH/NEURO: pleasant and cooperative, no obvious depression or anxiety, speech and thought processing grossly intact  ASSESSMENT AND PLAN:  Discussed the following assessment and plan:  No diagnosis found.     I discussed the assessment and treatment plan with the patient. The patient was provided an opportunity to ask questions and all were answered. The patient agreed with the plan and demonstrated an understanding of the instructions.   The  patient was advised to call back or seek an in-person evaluation if the symptoms worsen or if the condition fails to improve as anticipated.   Mable Paris, FNP

## 2018-08-21 ENCOUNTER — Encounter: Payer: Self-pay | Admitting: Family

## 2018-08-24 NOTE — Telephone Encounter (Signed)
The patient's appointment has been canceled.

## 2018-12-08 ENCOUNTER — Telehealth: Payer: Self-pay

## 2018-12-08 ENCOUNTER — Other Ambulatory Visit: Payer: Self-pay

## 2018-12-08 DIAGNOSIS — I10 Essential (primary) hypertension: Secondary | ICD-10-CM

## 2018-12-08 MED ORDER — ATORVASTATIN CALCIUM 20 MG PO TABS: ORAL_TABLET | ORAL | 3 refills | Status: DC

## 2018-12-08 MED ORDER — HYDROCHLOROTHIAZIDE 25 MG PO TABS: ORAL_TABLET | ORAL | 3 refills | Status: DC

## 2018-12-08 MED ORDER — AMLODIPINE BESYLATE 10 MG PO TABS: 10.0000 mg | ORAL_TABLET | Freq: Every day | ORAL | 3 refills | Status: DC

## 2018-12-08 NOTE — Telephone Encounter (Signed)
I spoke with patient to clarify pharmacy & asked to call his wife. I have & prescription was been sent.

## 2018-12-25 ENCOUNTER — Encounter: Payer: BLUE CROSS/BLUE SHIELD | Admitting: Internal Medicine

## 2018-12-29 ENCOUNTER — Other Ambulatory Visit: Payer: Self-pay

## 2018-12-31 ENCOUNTER — Encounter: Payer: Self-pay | Admitting: Internal Medicine

## 2018-12-31 ENCOUNTER — Ambulatory Visit (INDEPENDENT_AMBULATORY_CARE_PROVIDER_SITE_OTHER): Payer: No Typology Code available for payment source | Admitting: Internal Medicine

## 2018-12-31 ENCOUNTER — Other Ambulatory Visit: Payer: Self-pay

## 2018-12-31 VITALS — BP 114/82 | HR 63 | Temp 97.4°F | Resp 15 | Ht 72.0 in | Wt 280.4 lb

## 2018-12-31 DIAGNOSIS — Z125 Encounter for screening for malignant neoplasm of prostate: Secondary | ICD-10-CM | POA: Diagnosis not present

## 2018-12-31 DIAGNOSIS — E782 Mixed hyperlipidemia: Secondary | ICD-10-CM | POA: Diagnosis not present

## 2018-12-31 DIAGNOSIS — Z0000 Encounter for general adult medical examination without abnormal findings: Secondary | ICD-10-CM

## 2018-12-31 DIAGNOSIS — R7301 Impaired fasting glucose: Secondary | ICD-10-CM | POA: Insufficient documentation

## 2018-12-31 DIAGNOSIS — R058 Other specified cough: Secondary | ICD-10-CM | POA: Insufficient documentation

## 2018-12-31 DIAGNOSIS — R05 Cough: Secondary | ICD-10-CM | POA: Insufficient documentation

## 2018-12-31 DIAGNOSIS — E669 Obesity, unspecified: Secondary | ICD-10-CM

## 2018-12-31 DIAGNOSIS — G4700 Insomnia, unspecified: Secondary | ICD-10-CM

## 2018-12-31 DIAGNOSIS — I10 Essential (primary) hypertension: Secondary | ICD-10-CM | POA: Diagnosis not present

## 2018-12-31 LAB — PSA: PSA: 1.15 ng/mL (ref 0.10–4.00)

## 2018-12-31 LAB — LIPID PANEL
Cholesterol: 171 mg/dL (ref 0–200)
HDL: 39.4 mg/dL (ref >39.00)
LDL Cholesterol: 107 mg/dL — ABNORMAL HIGH (ref 0–99)
NonHDL: 131.98
Total CHOL/HDL Ratio: 4
Triglycerides: 124 mg/dL (ref 0.0–149.0)
VLDL: 24.8 mg/dL (ref 0.0–40.0)

## 2018-12-31 LAB — COMPREHENSIVE METABOLIC PANEL
ALT: 42 U/L (ref 0–53)
AST: 31 U/L (ref 0–37)
Albumin: 4.2 g/dL (ref 3.5–5.2)
Alkaline Phosphatase: 104 U/L (ref 39–117)
BUN: 15 mg/dL (ref 6–23)
CO2: 30 mEq/L (ref 19–32)
Calcium: 9.2 mg/dL (ref 8.4–10.5)
Chloride: 103 mEq/L (ref 96–112)
Creatinine, Ser: 0.8 mg/dL (ref 0.40–1.50)
GFR: 99.68 mL/min (ref >60.00)
Glucose, Bld: 105 mg/dL — ABNORMAL HIGH (ref 70–99)
Potassium: 3.3 mEq/L — ABNORMAL LOW (ref 3.5–5.1)
Sodium: 140 mEq/L (ref 135–145)
Total Bilirubin: 0.8 mg/dL (ref 0.2–1.2)
Total Protein: 6.5 g/dL (ref 6.0–8.3)

## 2018-12-31 LAB — TSH: TSH: 1.18 u[IU]/mL (ref 0.35–4.50)

## 2018-12-31 LAB — HEMOGLOBIN A1C: Hgb A1c MFr Bld: 5.3 % (ref 4.6–6.5)

## 2018-12-31 MED ORDER — POTASSIUM CHLORIDE CRYS ER 20 MEQ PO TBCR: 20.0000 meq | EXTENDED_RELEASE_TABLET | Freq: Every day | ORAL | 1 refills | Status: DC

## 2018-12-31 MED ORDER — ZOLPIDEM TARTRATE 5 MG PO TABS: 5.0000 mg | ORAL_TABLET | Freq: Every evening | ORAL | 1 refills | Status: DC | PRN

## 2018-12-31 NOTE — Assessment & Plan Note (Signed)
His random glucose is again elevated but not diagnostic of diabetes . a1c is also normal  Lab Results  Component Value Date   HGBA1C 5.3 12/31/2018

## 2018-12-31 NOTE — Assessment & Plan Note (Signed)
I have addressed  BMI and recommended wt loss of 10% of body weigh over the next 6 months using a low glycemic index diet and regular exercise a minimum of 5 days per week.   

## 2018-12-31 NOTE — Progress Notes (Signed)
Patient ID: Joseph Dalton, male    DOB: October 05, 1960  Age: 58 y.o. MRN: [id number]  The patient is here for annual PREVENTIVE examination and management of other chronic and acute problems.  Health maintenance:  Colonoscopy March 2020: 5 yr follow up due in 2025   The risk factors are reflected in the social history.  The roster of all physicians providing medical care to patient - is listed in the Snapshot section of the chart.  Activities of daily living:  The patient is 100% independent in all ADLs: dressing, toileting, feeding as well as independent mobility  Home safety : The patient has smoke detectors in the home. They wear seatbelts.  There are no firearms at home. There is no violence in the home.   There is no risks for hepatitis, STDs or HIV. There is no   history of blood transfusion. They have no travel history to infectious disease endemic areas of the world.  The patient has seen their dentist in the last six month. They have seen their eye doctor in the last year. They admit to slight hearing difficulty with regard to whispered voices and some television programs.  They have deferred audiologic testing in the last year.  They do not  have excessive sun exposure. Discussed the need for sun protection: hats, long sleeves and use of sunscreen if there is significant sun exposure.   Diet: the importance of a healthy diet is discussed. They do have a relatively healthy diet, but has a net positive calorie balance.  The benefits of regular aerobic exercise were discussed. He does not exercise since he works outside and considers his work to be exercise.  However he has a BMI of 38 which was discussed with patient today. .   Depression screen: there are no signs or vegative symptoms of depression- irritability, change in appetite, anhedonia, sadness/tearfullness.  The following portions of the patient's history were reviewed and updated as appropriate: allergies, current medications,  past family history, past medical history,  past surgical history, past social history  and problem list.  Visual acuity was not assessed per patient preference since she has regular follow up with her ophthalmologist. Hearing and body mass index were assessed and reviewed.   During the course of the visit the patient was educated and counseled about appropriate screening and preventive services including : fall prevention , diabetes screening, nutrition counseling, colorectal cancer screening, and recommended immunizations.    CC: The primary encounter diagnosis was Essential hypertension. Diagnoses of Mixed hyperlipidemia, Prostate cancer screening, Obesity (BMI 35.0-39.9 without comorbidity), Routine general medical examination at a health care facility, Obesity (BMI 30-39.9), Insomnia, unspecified type, Nocturnal cough, and Impaired fasting glucose were also pertinent to this visit.  1) insomnia: not occurring every night,  Starts when he has several jobs going at once and can't stop thinking about the details.  Trial of Trazodone did not help.   DOES NOT SNORE .  DENIES  DAYTIME SLEEPINESS     2) Occasional Non productive cough , occurs sometimes at night.  Using otc antihistamine. No prior trial of medication  for GERD. Denies reflux symptoms.   No fevers,  Weight loss,  Non smoker    History Joseph Dalton has a past medical history of HTN (hypertension), Hyperlipemia, and Venous insufficiency.   He has a past surgical history that includes No past surgeries and Colonoscopy with propofol (N/A, 06/15/2018).   His family history includes Heart disease (age of onset: 78) in his  father; Hyperlipidemia in his mother; Stroke in his mother.He reports that he has never smoked. His smokeless tobacco use includes chew. He reports current alcohol use of about 1.0 standard drinks of alcohol per week. He reports that he does not use drugs.  Outpatient Medications Prior to Visit  Medication Sig Dispense Refill   . amLODipine (NORVASC) 10 MG tablet Take 1 tablet (10 mg total) by mouth at bedtime. 90 tablet 3  . atorvastatin (LIPITOR) 20 MG tablet TAKE (1) TABLET BY MOUTH DAILY AT BEDTIME 90 tablet 3  . cyclobenzaprine (FLEXERIL) 10 MG tablet Take 1 tablet by mouth 3 (three) times daily as needed.  2  . etodolac (LODINE) 500 MG tablet Take 500 mg by mouth 2 (two) times daily.    . hydrochlorothiazide (HYDRODIURIL) 25 MG tablet TAKE (1) TABLET BY MOUTH EVERY DAY 90 tablet 3  . Multiple Vitamins-Minerals (MULTIVITAMIN WITH MINERALS) tablet Take 1 tablet by mouth daily.    . traZODone (DESYREL) 100 MG tablet Take 1 tablet (100 mg total) by mouth at bedtime as needed for sleep. (Patient not taking: Reported on 12/31/2018) 90 tablet 1  . predniSONE (DELTASONE) 10 MG tablet Take 40 mg by mouth on day 1, then taper 10 mg daily until gone (Patient not taking: Reported on 12/31/2018) 10 tablet 0   No facility-administered medications prior to visit.     Review of Systems   Patient denies headache, fevers, malaise, unintentional weight loss, skin rash, eye pain, sinus congestion and sinus pain, sore throat, dysphagia,  hemoptysis , cough, dyspnea, wheezing, chest pain, palpitations, orthopnea, edema, abdominal pain, nausea, melena, diarrhea, constipation, flank pain, dysuria, hematuria, urinary  Frequency, nocturia, numbness, tingling, seizures,  Focal weakness, Loss of consciousness,  Tremor, insomnia, depression, anxiety, and suicidal ideation.      Objective:  BP 114/82 (BP Location: Left Arm, Patient Position: Sitting, Cuff Size: Large)   Pulse 63   Temp (!) 97.4 F (36.3 C) (Temporal)   Resp 15   Ht 6' (1.829 m)   Wt 280 lb 6.4 oz (127.2 kg)   SpO2 97%   BMI 38.03 kg/m   Physical Exam   General appearance: alert, cooperative and appears stated age Ears: normal TM's and external ear canals both ears Throat: lips, mucosa, and tongue normal; teeth and gums normal Neck: no adenopathy, no carotid  bruit, supple, symmetrical, trachea midline and thyroid not enlarged, symmetric, no tenderness/mass/nodules Back: symmetric, no curvature. ROM normal. No CVA tenderness. Lungs: clear to auscultation bilaterally Heart: regular rate and rhythm, S1, S2 normal, no murmur, click, rub or gallop Abdomen: soft, non-tender; bowel sounds normal; no masses,  no organomegaly Pulses: 2+ and symmetric Skin: Skin color, texture, turgor normal. No rashes or lesions Lymph nodes: Cervical, supraclavicular, and axillary nodes normal.    Assessment & Plan:   Problem List Items Addressed This Visit      Unprioritized   Hyperlipidemia    Untreated 10 yr risk of events is 15% .  Continue atorvastatin . He has  no side effects and liver enzymes are due.   No changes today   Lab Results  Component Value Date   CHOL 171 12/31/2018   HDL 39.40 12/31/2018   LDLCALC 107 (H) 12/31/2018   LDLDIRECT 98.0 06/03/2016   TRIG 124.0 12/31/2018   CHOLHDL 4 12/31/2018   Lab Results  Component Value Date   ALT 42 12/31/2018   AST 31 12/31/2018   ALKPHOS 104 12/31/2018   BILITOT 0.8 12/31/2018  Relevant Orders   Lipid panel (Completed)   TSH (Completed)   Hypertension - Primary    Well controlled on current regimen. Renal function stable, potassium deficit addressed with supplementation and change from hctz to triamterene/hcyz.   Lab Results  Component Value Date   CREATININE 0.80 12/31/2018   Lab Results  Component Value Date   NA 140 12/31/2018   K 3.3 (L) 12/31/2018   CL 103 12/31/2018   CO2 30 12/31/2018         Relevant Orders   Comprehensive metabolic panel (Completed)   Routine general medical examination at a health care facility    age appropriate education and counseling updated, referrals for preventative services and immunizations addressed, dietary and smoking counseling addressed, most recent labs reviewed.  I have personally reviewed and have noted:  1) the patient's  medical and social history 2) The pt's use of alcohol, tobacco, and illicit drugs 3) The patient's current medications and supplements 4) Functional ability including ADL's, fall risk, home safety risk, hearing and visual impairment 5) Diet and physical activities 6) Evidence for depression or mood disorder 7) The patient's height, weight, and BMI have been recorded in the chart  I have made referrals, and provided counseling and education based on review of the above      Obesity (BMI 30-39.9)    I have addressed  BMI and recommended wt loss of 10% of body weigh over the next 6 months using a low glycemic index diet and regular exercise a minimum of 5 days per week.        Insomnia    Intermittent,  Occurring as a result of work stressors .   previous trial  Of trazodone ineffective at 100 mg dose.  Trial of ambien to be used used prn       Nocturnal cough    Currently trying an antihistamine.  .Discussed the possible etiologies of persistent cough including but not limited to post nasal drip,  Allergic rhinitis,  Asthma,  GERD and chronic cyclic cough due to persistent irritation.  he is not taking and ACE Inhibitor.  Suggested treating for allergic rhinitis,  PND and GERD to break cycle and reevaluate in a few weeks.       Impaired fasting glucose    His random glucose is again elevated but not diagnostic of diabetes . a1c is also normal  Lab Results  Component Value Date   HGBA1C 5.3 12/31/2018          Other Visit Diagnoses    Prostate cancer screening       Relevant Orders   PSA (Completed)   Obesity (BMI 35.0-39.9 without comorbidity)       Relevant Orders   Hemoglobin A1c (Completed)      I have discontinued Delno L. Dutko's predniSONE. I am also having him start on zolpidem and potassium chloride SA. Additionally, I am having him maintain his multivitamin with minerals, cyclobenzaprine, traZODone, etodolac, amLODipine, atorvastatin, and  hydrochlorothiazide.  Meds ordered this encounter  Medications  . zolpidem (AMBIEN) 5 MG tablet    Sig: Take 1 tablet (5 mg total) by mouth at bedtime as needed for sleep.    Dispense:  15 tablet    Refill:  1  . potassium chloride SA (K-DUR) 20 MEQ tablet    Sig: Take 1 tablet (20 mEq total) by mouth daily.    Dispense:  30 tablet    Refill:  1    Medications Discontinued During  This Encounter  Medication Reason  . predniSONE (DELTASONE) 10 MG tablet Patient has not taken in last 30 days    Follow-up: No follow-ups on file.   [name], MD

## 2018-12-31 NOTE — Assessment & Plan Note (Signed)

## 2018-12-31 NOTE — Patient Instructions (Addendum)
I RECOMMEND WEIGHT LOSS OF 28 LBS (10%) OVER THE NEXT 6 MONTHS  ELIMINATE STARCHES FROM  YOUR DIET  CONSIDER TURNIPS AS A SUBSTITUTE FOR POTATOES  USE WHOLE WHEAT BREAD  IF USING  PORK RINDS INSTEAD OF CHIPS!  NO REGULAR SODAS   The movie I mentioned is called "The Social Dilemma"    Health Maintenance, Male Adopting a healthy lifestyle and getting preventive care are important in promoting health and wellness. Ask your health care provider about:  The right schedule for you to have regular tests and exams.  Things you can do on your own to prevent diseases and keep yourself healthy. What should I know about diet, weight, and exercise? Eat a healthy diet   Eat a diet that includes plenty of vegetables, fruits, low-fat dairy products, and lean protein.  Do not eat a lot of foods that are high in solid fats, added sugars, or sodium. Maintain a healthy weight Body mass index (BMI) is a measurement that can be used to identify possible weight problems. It estimates body fat based on height and weight. Your health care provider can help determine your BMI and help you achieve or maintain a healthy weight. Get regular exercise Get regular exercise. This is one of the most important things you can do for your health. Most adults should:  Exercise for at least 150 minutes each week. The exercise should increase your heart rate and make you sweat (moderate-intensity exercise).  Do strengthening exercises at least twice a week. This is in addition to the moderate-intensity exercise.  Spend less time sitting. Even light physical activity can be beneficial. Watch cholesterol and blood lipids Have your blood tested for lipids and cholesterol at 57 years of age, then have this test every 5 years. You may need to have your cholesterol levels checked more often if:  Your lipid or cholesterol levels are high.  You are older than 57 years of age.  You are at high risk for heart  disease. What should I know about cancer screening? Many types of cancers can be detected early and may often be prevented. Depending on your health history and family history, you may need to have cancer screening at various ages. This may include screening for:  Colorectal cancer.  Prostate cancer.  Skin cancer.  Lung cancer. What should I know about heart disease, diabetes, and high blood pressure? Blood pressure and heart disease  High blood pressure causes heart disease and increases the risk of stroke. This is more likely to develop in people who have high blood pressure readings, are of African descent, or are overweight.  Talk with your health care provider about your target blood pressure readings.  Have your blood pressure checked: ? Every 3-5 years if you are 68-53 years of age. ? Every year if you are 35 years old or older.  If you are between the ages of 3 and 21 and are a current or former smoker, ask your health care provider if you should have a one-time screening for abdominal aortic aneurysm (AAA). Diabetes Have regular diabetes screenings. This checks your fasting blood sugar level. Have the screening done:  Once every three years after age 68 if you are at a normal weight and have a low risk for diabetes.  More often and at a younger age if you are overweight or have a high risk for diabetes. What should I know about preventing infection? Hepatitis B If you have a higher risk for hepatitis  B, you should be screened for this virus. Talk with your health care provider to find out if you are at risk for hepatitis B infection. Hepatitis C Blood testing is recommended for:  Everyone born from [birth year] through 1964.  Anyone with known risk factors for hepatitis C. Sexually transmitted infections (STIs)  You should be screened each year for STIs, including gonorrhea and chlamydia, if: ? You are sexually active and are younger than 57 years of age. ? You are older  than 57 years of age and your health care provider tells you that you are at risk for this type of infection. ? Your sexual activity has changed since you were last screened, and you are at increased risk for chlamydia or gonorrhea. Ask your health care provider if you are at risk.  Ask your health care provider about whether you are at high risk for HIV. Your health care provider may recommend a prescription medicine to help prevent HIV infection. If you choose to take medicine to prevent HIV, you should first get tested for HIV. You should then be tested every 3 months for as long as you are taking the medicine. Follow these instructions at home: Lifestyle  Do not use any products that contain nicotine or tobacco, such as cigarettes, e-cigarettes, and chewing tobacco. If you need help quitting, ask your health care provider.  Do not use street drugs.  Do not share needles.  Ask your health care provider for help if you need support or information about quitting drugs. Alcohol use  Do not drink alcohol if your health care provider tells you not to drink.  If you drink alcohol: ? Limit how much you have to 0-2 drinks a day. ? Be aware of how much alcohol is in your drink. In the [place] one drink equals one 12 oz bottle of beer (355 mL), one 5 oz glass of wine (148 mL), or one 1 oz glass of hard liquor (44 mL). General instructions  Schedule regular health, dental, and eye exams.  Stay current with your vaccines.  Tell your health care provider if: ? You often feel depressed. ? You have ever been abused or do not feel safe at home. Summary  Adopting a healthy lifestyle and getting preventive care are important in promoting health and wellness.  Follow your health care provider's instructions about healthy diet, exercising, and getting tested or screened for diseases.  Follow your health care provider's instructions on monitoring your cholesterol and blood pressure. This  information is not intended to replace advice given to you by your health care provider. Make sure you discuss any questions you have with your health care provider. Document Released: 09/21/2007 Document Revised: 03/18/2018 Document Reviewed: 03/18/2018 Elsevier Patient Education  2020 [employer].

## 2018-12-31 NOTE — Assessment & Plan Note (Signed)
Untreated 10 yr risk of events is 15% .  Continue atorvastatin . He has  no side effects and liver enzymes are due.   No changes today   Lab Results  Component Value Date   CHOL 171 12/31/2018   HDL 39.40 12/31/2018   LDLCALC 107 (H) 12/31/2018   LDLDIRECT 98.0 06/03/2016   TRIG 124.0 12/31/2018   CHOLHDL 4 12/31/2018   Lab Results  Component Value Date   ALT 42 12/31/2018   AST 31 12/31/2018   ALKPHOS 104 12/31/2018   BILITOT 0.8 12/31/2018    

## 2018-12-31 NOTE — Assessment & Plan Note (Signed)
Intermittent,  Occurring as a result of work stressors .   previous trial  Of trazodone ineffective at 100 mg dose.  Trial of ambien to be used used prn

## 2018-12-31 NOTE — Assessment & Plan Note (Signed)
Currently trying an antihistamine.  .Discussed the possible etiologies of persistent cough including but not limited to post nasal drip,  Allergic rhinitis,  Asthma,  GERD and chronic cyclic cough due to persistent irritation.  he is not taking and ACE Inhibitor.  Suggested treating for allergic rhinitis,  PND and GERD to break cycle and reevaluate in a few weeks.

## 2018-12-31 NOTE — Assessment & Plan Note (Signed)
Well controlled on current regimen. Renal function stable, potassium deficit addressed with supplementation and change from hctz to triamterene/hcyz.   Lab Results  Component Value Date   CREATININE 0.80 12/31/2018   Lab Results  Component Value Date   NA 140 12/31/2018   K 3.3 (L) 12/31/2018   CL 103 12/31/2018   CO2 30 12/31/2018

## 2019-02-03 MED ORDER — TRIAMTERENE-HCTZ 37.5-25 MG PO TABS: 1.0000 | ORAL_TABLET | Freq: Every day | ORAL | 3 refills | Status: DC

## 2019-02-07 ENCOUNTER — Other Ambulatory Visit: Payer: Self-pay | Admitting: Internal Medicine

## 2019-04-13 ENCOUNTER — Ambulatory Visit: Payer: No Typology Code available for payment source | Attending: Internal Medicine

## 2019-04-13 ENCOUNTER — Other Ambulatory Visit: Payer: No Typology Code available for payment source

## 2019-04-13 DIAGNOSIS — Z20822 Contact with and (suspected) exposure to covid-19: Secondary | ICD-10-CM

## 2019-04-15 LAB — NOVEL CORONAVIRUS, NAA: SARS-CoV-2, NAA: NOT DETECTED

## 2019-06-29 ENCOUNTER — Other Ambulatory Visit: Payer: Self-pay

## 2019-07-01 ENCOUNTER — Encounter: Payer: Self-pay | Admitting: Internal Medicine

## 2019-07-01 ENCOUNTER — Other Ambulatory Visit: Payer: Self-pay

## 2019-07-01 ENCOUNTER — Ambulatory Visit (INDEPENDENT_AMBULATORY_CARE_PROVIDER_SITE_OTHER): Payer: No Typology Code available for payment source | Admitting: Internal Medicine

## 2019-07-01 VITALS — BP 140/80 | HR 67 | Temp 97.0°F | Ht 74.0 in | Wt 284.4 lb

## 2019-07-01 DIAGNOSIS — G4700 Insomnia, unspecified: Secondary | ICD-10-CM

## 2019-07-01 DIAGNOSIS — E782 Mixed hyperlipidemia: Secondary | ICD-10-CM | POA: Diagnosis not present

## 2019-07-01 DIAGNOSIS — E876 Hypokalemia: Secondary | ICD-10-CM

## 2019-07-01 DIAGNOSIS — I10 Essential (primary) hypertension: Secondary | ICD-10-CM | POA: Diagnosis not present

## 2019-07-01 DIAGNOSIS — E669 Obesity, unspecified: Secondary | ICD-10-CM

## 2019-07-01 DIAGNOSIS — D239 Other benign neoplasm of skin, unspecified: Secondary | ICD-10-CM

## 2019-07-01 DIAGNOSIS — R7301 Impaired fasting glucose: Secondary | ICD-10-CM

## 2019-07-01 LAB — COMPREHENSIVE METABOLIC PANEL
ALT: 35 U/L (ref 0–53)
AST: 24 U/L (ref 0–37)
Albumin: 4.1 g/dL (ref 3.5–5.2)
Alkaline Phosphatase: 105 U/L (ref 39–117)
BUN: 21 mg/dL (ref 6–23)
CO2: 31 mEq/L (ref 19–32)
Calcium: 9.1 mg/dL (ref 8.4–10.5)
Chloride: 102 mEq/L (ref 96–112)
Creatinine, Ser: 0.89 mg/dL (ref 0.40–1.50)
GFR: 87.98 mL/min (ref >60.00)
Glucose, Bld: 102 mg/dL — ABNORMAL HIGH (ref 70–99)
Potassium: 4.2 mEq/L (ref 3.5–5.1)
Sodium: 138 mEq/L (ref 135–145)
Total Bilirubin: 0.6 mg/dL (ref 0.2–1.2)
Total Protein: 6.5 g/dL (ref 6.0–8.3)

## 2019-07-01 NOTE — Assessment & Plan Note (Signed)
Not using trazodone or ambein. rec trial of melatonin

## 2019-07-01 NOTE — Patient Instructions (Addendum)
Try  Taking 10 mg melatonin at dinnertime  For your insomnia   Keep an eye on your blood pressure.  Our goal is 130/80 or less   Continue triamterene/hctz and amlodipine for blood pressure and atorvastatin for the cholesterol

## 2019-07-01 NOTE — Progress Notes (Signed)
Subjective:  Patient ID: Joseph Dalton, male    DOB: 12-30-1960  Age: 59 y.o. MRN: [id number]  CC: The primary encounter diagnosis was Hypokalemia. Diagnoses of Benign fibromatous neoplasm of skin, Mixed hyperlipidemia, White coat syndrome with diagnosis of hypertension, Obesity (BMI 30-39.9), Impaired fasting glucose, and Insomnia, unspecified type were also pertinent to this visit.  HPI THADEN ABAJIAN presents for 6 month follow up on IPG, hypertension and chronic insomnia    This visit occurred during the SARS-CoV-2 public health emergency.  Safety protocols were in place, including screening questions prior to the visit, additional usage of staff PPE, and extensive cleaning of exam room while observing appropriate contact time as indicated for disinfecting solutions.   Hypertension: patient checks blood pressure twice weekly at home.  Readings have been for the most part around  140/80 at rest  Or lower.  He has white coat syndrome and so his office readings are frequently higher.  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  Has not had any back issues in 2 years.   Insomnia doesn't use trazodone or ambien due to DOT restrictions   Needs dermatologist  Does not want to return to [facility] due to techniquie of removal of a sebaceous cyst on belt line   derm referral needed     Outpatient Medications Prior to Visit  Medication Sig Dispense Refill  . amLODipine (NORVASC) 10 MG tablet Take 1 tablet (10 mg total) by mouth at bedtime. 90 tablet 3  . atorvastatin (LIPITOR) 20 MG tablet TAKE (1) TABLET BY MOUTH DAILY AT BEDTIME 90 tablet 3  . etodolac (LODINE) 500 MG tablet Take 500 mg by mouth 2 (two) times daily.    . Multiple Vitamins-Minerals (MULTIVITAMIN WITH MINERALS) tablet Take 1 tablet by mouth daily.    Marland Kitchen  triamterene-hydrochlorothiazide (MAXZIDE-25) 37.5-25 MG tablet Take 1 tablet by mouth daily. 90 tablet 3  . cyclobenzaprine (FLEXERIL) 10 MG tablet Take 1 tablet by mouth 3 (three) times daily as needed.  2  . hydrochlorothiazide (HYDRODIURIL) 25 MG tablet TAKE (1) TABLET BY MOUTH EVERY DAY 90 tablet 3  . potassium chloride SA (K-DUR) 20 MEQ tablet Take 1 tablet (20 mEq total) by mouth daily. 30 tablet 1  . traZODone (DESYREL) 100 MG tablet Take 1 tablet (100 mg total) by mouth at bedtime as needed for sleep. 90 tablet 1  . zolpidem (AMBIEN) 5 MG tablet Take 1 tablet (5 mg total) by mouth at bedtime as needed for sleep. 15 tablet 1   No facility-administered medications prior to visit.    Review of Systems;  Patient denies headache, fevers, malaise, unintentional weight loss, skin rash, eye pain, sinus congestion and sinus pain, sore throat, dysphagia,  hemoptysis , cough, dyspnea, wheezing, chest pain, palpitations, orthopnea, edema, abdominal pain, nausea, melena, diarrhea, constipation, flank pain, dysuria, hematuria, urinary  Frequency, nocturia, numbness, tingling, seizures,  Focal weakness, Loss of consciousness,  Tremor, insomnia, depression, anxiety, and suicidal ideation.      Objective:  BP 140/80   Pulse 67   Temp (!) 97 F (36.1 C) (Temporal)   Ht 6\' 2"  (1.88 m)   Wt 284 lb 6.4 oz (129 kg)   SpO2 98%   BMI 36.51 kg/m   BP Readings from Last 3 Encounters:  07/01/19 140/80  12/31/18 114/82  06/19/18 126/74  Wt Readings from Last 3 Encounters:  07/01/19 284 lb 6.4 oz (129 kg)  12/31/18 280 lb 6.4 oz (127.2 kg)  06/19/18 278 lb 6.4 oz (126.3 kg)    General appearance: alert, cooperative and appears stated age Ears: normal TM's and external ear canals both ears Throat: lips, mucosa, and tongue normal; teeth and gums normal Neck: no adenopathy, no carotid bruit, supple, symmetrical, trachea midline and thyroid not enlarged, symmetric, no  tenderness/mass/nodules Back: symmetric, no curvature. ROM normal. No CVA tenderness. Lungs: clear to auscultation bilaterally Chest:  Small follicular papule near nipple with chronic changes. Not enflamed  Heart: regular rate and rhythm, S1, S2 normal, no murmur, click, rub or gallop Abdomen: soft, non-tender; bowel sounds normal; no masses,  no organomegaly Pulses: 2+ and symmetric Skin: Skin color, texture, turgor normal. No rashes or lesions Lymph nodes: Cervical, supraclavicular, and axillary nodes normal.  Lab Results  Component Value Date   HGBA1C 5.3 12/31/2018   HGBA1C 5.3 06/19/2018   HGBA1C 5.4 06/11/2017    Lab Results  Component Value Date   CREATININE 0.80 12/31/2018   CREATININE 0.97 06/19/2018   CREATININE 0.86 12/19/2017    Lab Results  Component Value Date   WBC 6.6 10/05/2015   HGB 15.9 10/05/2015   HCT 46.4 10/05/2015   PLT 191.0 10/05/2015   GLUCOSE 105 (H) 12/31/2018   CHOL 171 12/31/2018   TRIG 124.0 12/31/2018   HDL 39.40 12/31/2018   LDLDIRECT 98.0 06/03/2016   LDLCALC 107 (H) 12/31/2018   ALT 42 12/31/2018   AST 31 12/31/2018   NA 140 12/31/2018   K 3.3 (L) 12/31/2018   CL 103 12/31/2018   CREATININE 0.80 12/31/2018   BUN 15 12/31/2018   CO2 30 12/31/2018   TSH 1.18 12/31/2018   PSA 1.15 12/31/2018   HGBA1C 5.3 12/31/2018   MICROALBUR <0.7 06/11/2017    No results found.  Assessment & Plan:   Problem List Items Addressed This Visit      Unprioritized   Hyperlipidemia    Untreated 10 yr risk of events is 15% .  Continue atorvastatin . He has  no side effects and liver enzymes are due.   No changes today   Lab Results  Component Value Date   CHOL 171 12/31/2018   HDL 39.40 12/31/2018   LDLCALC 107 (H) 12/31/2018   LDLDIRECT 98.0 06/03/2016   TRIG 124.0 12/31/2018   CHOLHDL 4 12/31/2018   Lab Results  Component Value Date   ALT 42 12/31/2018   AST 31 12/31/2018   ALKPHOS 104 12/31/2018   BILITOT 0.8 12/31/2018          White coat syndrome with diagnosis of hypertension    Regimen was changed after last visit due to hypokalemia.  Now taking maxzide  Instead of hctz.  he reports compliance with medication regimen  but has an elevated reading today in office.  She is not using NSAIDs daily.  Discussed goal of 120/70  (130/80 for patients over 70)  to preserve renal function.  he has been asked to check her  BP  at home and  submit readings for evaluation. Renal function, electrolyte scree is due l      Obesity (BMI 30-39.9)    I have addressed  BMI and recommended a low glycemic index diet utilizing smaller more frequent meals to increase metabolism.  I have also recommended that patient start exercising with a goal of 30 minutes of aerobic exercise a minimum of 5  days per week.       Insomnia    Not using trazodone or ambein. rec trial of melatonin      Impaired fasting glucose    His random glucose is again elevated but not diagnostic of diabetes . a1c has been repeatedly normal  Lab Results  Component Value Date   HGBA1C 5.3 12/31/2018          Other Visit Diagnoses    Hypokalemia    -  Primary   Relevant Orders   Comprehensive metabolic panel   Benign fibromatous neoplasm of skin       Relevant Orders   Ambulatory referral to Dermatology      I have discontinued Marquies L. Nunes "Bob"'s cyclobenzaprine, traZODone, hydrochlorothiazide, zolpidem, and potassium chloride SA. I am also having him maintain his multivitamin with minerals, etodolac, amLODipine, atorvastatin, and triamterene-hydrochlorothiazide.  No orders of the defined types were placed in this encounter.   Medications Discontinued During This Encounter  Medication Reason  . hydrochlorothiazide (HYDRODIURIL) 25 MG tablet   . potassium chloride SA (K-DUR) 20 MEQ tablet   . zolpidem (AMBIEN) 5 MG tablet   . traZODone (DESYREL) 100 MG tablet   . cyclobenzaprine (FLEXERIL) 10 MG tablet     Follow-up: No follow-ups on  file.   [name], MD

## 2019-07-01 NOTE — Assessment & Plan Note (Signed)
His random glucose is again elevated but not diagnostic of diabetes . a1c has been repeatedly normal  Lab Results  Component Value Date   HGBA1C 5.3 12/31/2018

## 2019-07-01 NOTE — Assessment & Plan Note (Signed)
I have addressed  BMI and recommended a low glycemic index diet utilizing smaller more frequent meals to increase metabolism.  I have also recommended that patient start exercising with a goal of 30 minutes of aerobic exercise a minimum of 5 days per week.  

## 2019-07-01 NOTE — Assessment & Plan Note (Signed)
Untreated 10 yr risk of events is 15% .  Continue atorvastatin . He has  no side effects and liver enzymes are due.   No changes today   Lab Results  Component Value Date   CHOL 171 12/31/2018   HDL 39.40 12/31/2018   LDLCALC 107 (H) 12/31/2018   LDLDIRECT 98.0 06/03/2016   TRIG 124.0 12/31/2018   CHOLHDL 4 12/31/2018   Lab Results  Component Value Date   ALT 42 12/31/2018   AST 31 12/31/2018   ALKPHOS 104 12/31/2018   BILITOT 0.8 12/31/2018

## 2019-07-01 NOTE — Assessment & Plan Note (Addendum)
Regimen was changed after last visit due to hypokalemia.  Now taking maxzide  Instead of hctz.  he reports compliance with medication regimen  but has an elevated reading today in office.  She is not using NSAIDs daily.  Discussed goal of 120/70  (130/80 for patients over 70)  to preserve renal function.  he has been asked to check her  BP  at home and  submit readings for evaluation. Renal function, electrolyte scree is due l

## 2019-10-07 ENCOUNTER — Other Ambulatory Visit: Payer: Self-pay | Admitting: Internal Medicine

## 2019-11-29 ENCOUNTER — Other Ambulatory Visit: Payer: Self-pay | Admitting: Internal Medicine

## 2020-01-05 ENCOUNTER — Other Ambulatory Visit: Payer: Self-pay

## 2020-01-05 ENCOUNTER — Ambulatory Visit (INDEPENDENT_AMBULATORY_CARE_PROVIDER_SITE_OTHER): Payer: BC Managed Care – PPO | Admitting: Internal Medicine

## 2020-01-05 ENCOUNTER — Encounter: Payer: Self-pay | Admitting: Internal Medicine

## 2020-01-05 VITALS — BP 128/86 | HR 63 | Temp 98.5°F | Resp 15 | Ht 74.0 in | Wt 269.8 lb

## 2020-01-05 DIAGNOSIS — Z125 Encounter for screening for malignant neoplasm of prostate: Secondary | ICD-10-CM

## 2020-01-05 DIAGNOSIS — G4700 Insomnia, unspecified: Secondary | ICD-10-CM | POA: Diagnosis not present

## 2020-01-05 DIAGNOSIS — I10 Essential (primary) hypertension: Secondary | ICD-10-CM

## 2020-01-05 DIAGNOSIS — Z0000 Encounter for general adult medical examination without abnormal findings: Secondary | ICD-10-CM | POA: Diagnosis not present

## 2020-01-05 DIAGNOSIS — R7301 Impaired fasting glucose: Secondary | ICD-10-CM

## 2020-01-05 DIAGNOSIS — N644 Mastodynia: Secondary | ICD-10-CM | POA: Diagnosis not present

## 2020-01-05 DIAGNOSIS — E782 Mixed hyperlipidemia: Secondary | ICD-10-CM

## 2020-01-05 LAB — COMPREHENSIVE METABOLIC PANEL
ALT: 29 U/L (ref 0–53)
AST: 23 U/L (ref 0–37)
Albumin: 4.3 g/dL (ref 3.5–5.2)
Alkaline Phosphatase: 100 U/L (ref 39–117)
BUN: 19 mg/dL (ref 6–23)
CO2: 29 mEq/L (ref 19–32)
Calcium: 9.2 mg/dL (ref 8.4–10.5)
Chloride: 100 mEq/L (ref 96–112)
Creatinine, Ser: 0.98 mg/dL (ref 0.40–1.50)
GFR: 78.58 mL/min (ref >60.00)
Glucose, Bld: 102 mg/dL — ABNORMAL HIGH (ref 70–99)
Potassium: 3.7 mEq/L (ref 3.5–5.1)
Sodium: 137 mEq/L (ref 135–145)
Total Bilirubin: 0.8 mg/dL (ref 0.2–1.2)
Total Protein: 6.8 g/dL (ref 6.0–8.3)

## 2020-01-05 LAB — LIPID PANEL
Cholesterol: 173 mg/dL (ref 0–200)
HDL: 43.9 mg/dL (ref >39.00)
LDL Cholesterol: 108 mg/dL — ABNORMAL HIGH (ref 0–99)
NonHDL: 128.86
Total CHOL/HDL Ratio: 4
Triglycerides: 104 mg/dL (ref 0.0–149.0)
VLDL: 20.8 mg/dL (ref 0.0–40.0)

## 2020-01-05 LAB — HEMOGLOBIN A1C: Hgb A1c MFr Bld: 5.4 % (ref 4.6–6.5)

## 2020-01-05 LAB — PSA: PSA: 1.97 ng/mL (ref 0.10–4.00)

## 2020-01-05 NOTE — Progress Notes (Signed)
Patient ID: Joseph Dalton, male    DOB: [birth year]  Age: 59 y.o. MRN: [id number]  The patient is here for annual  wellness examination and management of other chronic and acute problems.   The risk factors are reflected in the social history.  The roster of all physicians providing medical care to patient - is listed in the Snapshot section of the chart.  Activities of daily living:  The patient is 100% independent in all ADLs: dressing, toileting, feeding as well as independent mobility  Home safety : The patient has smoke detectors in the home. They wear seatbelts.  There are no firearms at home. There is no violence in the home.   There is no risks for hepatitis, STDs or HIV. There is no   history of blood transfusion. They have no travel history to infectious disease endemic areas of the world.  The patient has seen their dentist in the last six month. They have seen their eye doctor in the last year. They admit to slight hearing difficulty with regard to whispered voices and some television programs.  They have deferred audiologic testing in the last year.  They do not  have excessive sun exposure. Discussed the need for sun protection: hats, long sleeves and use of sunscreen if there is significant sun exposure.   Diet: the importance of a healthy diet is discussed. They do have a healthy diet.  The benefits of regular aerobic exercise were discussed. Joseph Dalton does not exercise regularly   Depression screen: there are no signs or vegative symptoms of depression- irritability, change in appetite, anhedonia, sadness/tearfullness.  Cognitive assessment: the patient manages all their financial and personal affairs and is actively engaged. They could relate day,date,year and events; recalled 2/3 objects at 3 minutes; performed clock-face test normally.  The following portions of the patient's history were reviewed and updated as appropriate: allergies, current medications, past family history, past  medical history,  past surgical history, past social history  and problem list.  Visual acuity was not assessed per patient preference since she has regular follow up with her ophthalmologist. Hearing and body mass index were assessed and reviewed.   During the course of the visit the patient was educated and counseled about appropriate screening and preventive services including : fall prevention , diabetes screening, nutrition counseling, colorectal cancer screening, and recommended immunizations.    CC: The primary encounter diagnosis was Prostate cancer screening. Diagnoses of Mixed hyperlipidemia, Impaired fasting glucose, Insomnia, unspecified type, Breast tenderness in male, Pain of left breast, White coat syndrome with diagnosis of hypertension, and Routine general medical examination at a health care facility were also pertinent to this visit. follow up on hypertension, hyperlipidemia, and IPG  This visit occurred during the SARS-CoV-2 public health emergency.  Safety protocols were in place, including screening questions prior to the visit, additional usage of staff PPE, and extensive cleaning of exam room while observing appropriate contact time as indicated for disinfecting solutions.  Cc:  Left nipple pain for several months. Slight change in appearance,  Thinks it may have some swelling     Received the Pfizer vaccine #1 due to a friend being critically ill but surviving a prolonged lllness .   Hypertension: patient checks blood pressure twice weekly at home.  Readings have been for the most part < 140/80 at rest . Patient is following a reduce salt diet most days and is taking his medications as prescribed  Obesity:  Joseph Dalton has lost 15 lbs since  his last visit by reducing portion sizes and eliminating  milk   Hyperlipidemia :  Taking statin without side effects   In general Joseph Dalton feels well,  But finds that she needs a nap by 2 pm.  Not taking sleeping meds    History Joseph Dalton has a  past medical history of HTN (hypertension), Hyperlipemia, and Venous insufficiency.   Joseph Dalton has a past surgical history that includes No past surgeries and Colonoscopy with propofol (N/A, 06/15/2018).  His family history includes Heart disease (age of onset: 34) in his father; Hyperlipidemia in his mother; Stroke in his mother.Joseph Dalton reports that Joseph Dalton has never smoked. His smokeless tobacco use includes chew. Joseph Dalton reports current alcohol use of about 1.0 standard drink of alcohol per week. Joseph Dalton reports that Joseph Dalton does not use drugs.  Outpatient Medications Prior to Visit  Medication Sig Dispense Refill  . amLODipine (NORVASC) 10 MG tablet TAKE (1) TABLET BY MOUTH DAILY AT BEDTIME 90 tablet 3  . atorvastatin (LIPITOR) 20 MG tablet TAKE ONE TABLET BY MOUTH DAILY AT BEDTIME. 90 tablet 3  . etodolac (LODINE) 500 MG tablet Take 500 mg by mouth 2 (two) times daily.    . Multiple Vitamins-Minerals (MULTIVITAMIN WITH MINERALS) tablet Take 1 tablet by mouth daily.    Marland Kitchen triamterene-hydrochlorothiazide (MAXZIDE-25) 37.5-25 MG tablet TAKE ONE (1) TABLET BY MOUTH ONCE DAILY 90 tablet 3   No facility-administered medications prior to visit.    Review of Systems   Patient denies headache, fevers, malaise, unintentional weight loss, skin rash, eye pain, sinus congestion and sinus pain, sore throat, dysphagia,  hemoptysis , cough, dyspnea, wheezing, chest pain, palpitations, orthopnea, edema, abdominal pain, nausea, melena, diarrhea, constipation, flank pain, dysuria, hematuria, urinary  Frequency, nocturia, numbness, tingling, seizures,  Focal weakness, Loss of consciousness,  Tremor, insomnia, depression, anxiety, and suicidal ideation.      Objective:  BP 128/86 (BP Location: Left Arm, Patient Position: Sitting, Cuff Size: Large)   Pulse 63   Temp 98.5 F (36.9 C) (Oral)   Resp 15   Ht 6\' 2"  (1.88 m)   Wt 269 lb 12.8 oz (122.4 kg)   SpO2 98%   BMI 34.64 kg/m   Physical Exam  General appearance: alert,  cooperative and appears stated age Head: Normocephalic, without obvious abnormality, atraumatic Eyes: conjunctivae/corneas clear. PERRL, EOM's intact. Fundi benign. Ears: normal TM's and external ear canals both ears Nose: Nares normal. Septum midline. Mucosa normal. No drainage or sinus tenderness. Throat: lips, mucosa, and tongue normal; teeth and gums normal Neck: no adenopathy, no carotid bruit, no JVD, supple, symmetrical, trachea midline and thyroid not enlarged, symmetric, no tenderness/mass/nodules Lungs: clear to auscultation bilaterally Breasts: mild gynecomastia.  Left breast slightly more full, no masses,  Diffuse tenderness Heart: regular rate and rhythm, S1, S2 normal, no murmur, click, rub or gallop Abdomen: soft, non-tender; bowel sounds normal; no masses,  no organomegaly Extremities: extremities normal, atraumatic, no cyanosis or edema Pulses: 2+ and symmetric Skin: Skin color, texture, turgor normal. No rashes or lesions Neurologic: Alert and oriented X 3, normal strength and tone. Normal symmetric reflexes. Normal coordination and gait.    Assessment & Plan:   Problem List Items Addressed This Visit      Unprioritized   Hyperlipidemia    Untreated 10 yr risk of events is 15% .  Continue atorvastatin 20 mg daily. Marland Kitchen Joseph Dalton has  no side effects and liver enzymes are due.   No changes today   Lab Results  Component Value  Date   CHOL 173 01/05/2020   HDL 43.90 01/05/2020   LDLCALC 108 (H) 01/05/2020   LDLDIRECT 98.0 06/03/2016   TRIG 104.0 01/05/2020   CHOLHDL 4 01/05/2020   Lab Results  Component Value Date   ALT 29 01/05/2020   AST 23 01/05/2020   ALKPHOS 100 01/05/2020   BILITOT 0.8 01/05/2020         Relevant Orders   Lipid panel (Completed)   Comprehensive metabolic panel (Completed)   Impaired fasting glucose    Joseph Dalton has had normal A1cs  AND HAS has achieved weight loss and careful diet.   Lab Results  Component Value Date   HGBA1C 5.4 01/05/2020          Relevant Orders   Hemoglobin A1c (Completed)   Insomnia    Has tried melatonin at dinner.  Not taking anything currently.  "wheels spinning" declines additional medication trials       Pain of left breast    Diagnosis includes gynecomastia with edema,  Mass,  Abscess.  Prolactin, Diagnostic mammogram ordered       Routine general medical examination at a health care facility    age appropriate education and counseling updated, referrals for preventative services and immunizations addressed, dietary and smoking counseling addressed, most recent labs reviewed.  I have personally reviewed and have noted:  1) the patient's medical and social history 2) The pt's use of alcohol, tobacco, and illicit drugs 3) The patient's current medications and supplements 4) Functional ability including ADL's, fall risk, home safety risk, hearing and visual impairment 5) Diet and physical activities 6) Evidence for depression or mood disorder 7) The patient's height, weight, and BMI have been recorded in the chart  I have made referrals, and provided counseling and education based on review of the above      White coat syndrome with diagnosis of hypertension    IMPROVED  readings with weight loss.  Continue amlodipine 10 mg daily and maxzide 37.5/25 mg daily  Lab Results  Component Value Date   CREATININE 0.98 01/05/2020   Lab Results  Component Value Date   NA 137 01/05/2020   K 3.7 01/05/2020   CL 100 01/05/2020   CO2 29 01/05/2020          Other Visit Diagnoses    Prostate cancer screening    -  Primary   Relevant Orders   PSA (Completed)   Breast tenderness in male       Relevant Orders   MM Digital Diagnostic Bilat   Prolactin (Completed)      I am having Joseph Dalton "Joseph Dalton" maintain his multivitamin with minerals, etodolac, amLODipine, atorvastatin, and triamterene-hydrochlorothiazide.  No orders of the defined types were placed in this encounter.   There  are no discontinued medications.  Follow-up: Return in about 6 months (around 07/04/2020).   [name], MD

## 2020-01-05 NOTE — Assessment & Plan Note (Addendum)
Has tried melatonin at dinner.  Not taking anything currently.  "wheels spinning" declines additional medication trials

## 2020-01-05 NOTE — Patient Instructions (Signed)
Mammogram ordered  Fasting labs and PSA done today

## 2020-01-06 DIAGNOSIS — N644 Mastodynia: Secondary | ICD-10-CM | POA: Insufficient documentation

## 2020-01-06 LAB — PROLACTIN: Prolactin: 24.5 ng/mL — ABNORMAL HIGH (ref 2.0–18.0)

## 2020-01-06 NOTE — Assessment & Plan Note (Signed)

## 2020-01-06 NOTE — Assessment & Plan Note (Signed)
IMPROVED  readings with weight loss.  Continue amlodipine 10 mg daily and maxzide 37.5/25 mg daily  Lab Results  Component Value Date   CREATININE 0.98 01/05/2020   Lab Results  Component Value Date   NA 137 01/05/2020   K 3.7 01/05/2020   CL 100 01/05/2020   CO2 29 01/05/2020

## 2020-01-06 NOTE — Assessment & Plan Note (Addendum)
He has had normal A1cs  AND HAS has achieved weight loss and careful diet.   Lab Results  Component Value Date   HGBA1C 5.4 01/05/2020

## 2020-01-06 NOTE — Assessment & Plan Note (Addendum)
Diagnosis includes gynecomastia with edema,  Mass,  Abscess.  Prolactin, Diagnostic mammogram ordered

## 2020-01-06 NOTE — Assessment & Plan Note (Signed)
Untreated 10 yr risk of events is 15% .  Continue atorvastatin 20 mg daily. Marland Kitchen He has  no side effects and liver enzymes are due.   No changes today   Lab Results  Component Value Date   CHOL 173 01/05/2020   HDL 43.90 01/05/2020   LDLCALC 108 (H) 01/05/2020   LDLDIRECT 98.0 06/03/2016   TRIG 104.0 01/05/2020   CHOLHDL 4 01/05/2020   Lab Results  Component Value Date   ALT 29 01/05/2020   AST 23 01/05/2020   ALKPHOS 100 01/05/2020   BILITOT 0.8 01/05/2020

## 2020-01-07 NOTE — Progress Notes (Signed)
Labs normal except the elevated prolactin level MAY indicate a pituitary tumor which if present can cause the breast to swell.  I recommend proceeding with the mammogram to rule out a mass,  but he will also need to have an MRI brain.  If he is willing  I will order it

## 2020-01-20 NOTE — Telephone Encounter (Signed)
I do not see where a MRI has been ordered. Gave the pt the number to call and schedule the mammogram.

## 2020-01-22 ENCOUNTER — Other Ambulatory Visit: Payer: Self-pay | Admitting: Internal Medicine

## 2020-01-22 DIAGNOSIS — R7989 Other specified abnormal findings of blood chemistry: Secondary | ICD-10-CM

## 2020-01-22 DIAGNOSIS — N644 Mastodynia: Secondary | ICD-10-CM

## 2020-01-24 ENCOUNTER — Other Ambulatory Visit: Payer: Self-pay | Admitting: Internal Medicine

## 2020-01-24 DIAGNOSIS — N644 Mastodynia: Secondary | ICD-10-CM

## 2020-01-25 ENCOUNTER — Other Ambulatory Visit: Payer: Self-pay | Admitting: Internal Medicine

## 2020-01-26 ENCOUNTER — Other Ambulatory Visit: Payer: Self-pay | Admitting: Internal Medicine

## 2020-02-04 ENCOUNTER — Ambulatory Visit
Admission: EM | Admit: 2020-02-04 | Discharge: 2020-02-04 | Disposition: A | Discharge status: Home / Self Care | Payer: BC Managed Care – PPO | Attending: Physician Assistant | Admitting: Physician Assistant

## 2020-02-04 ENCOUNTER — Encounter: Payer: Self-pay | Admitting: Emergency Medicine

## 2020-02-04 ENCOUNTER — Ambulatory Visit (INDEPENDENT_AMBULATORY_CARE_PROVIDER_SITE_OTHER): Payer: BC Managed Care – PPO

## 2020-02-04 ENCOUNTER — Other Ambulatory Visit: Payer: Self-pay

## 2020-02-04 DIAGNOSIS — R112 Nausea with vomiting, unspecified: Secondary | ICD-10-CM

## 2020-02-04 DIAGNOSIS — N179 Acute kidney failure, unspecified: Secondary | ICD-10-CM | POA: Diagnosis present

## 2020-02-04 DIAGNOSIS — R1030 Lower abdominal pain, unspecified: Secondary | ICD-10-CM | POA: Insufficient documentation

## 2020-02-04 LAB — URINALYSIS, COMPLETE (UACMP) WITH MICROSCOPIC
Bacteria, UA: NONE SEEN
Bilirubin Urine: NEGATIVE
Glucose, UA: NEGATIVE mg/dL
Ketones, ur: NEGATIVE mg/dL
Leukocytes,Ua: NEGATIVE
Nitrite: NEGATIVE
Protein, ur: NEGATIVE mg/dL
Specific Gravity, Urine: 1.02 (ref 1.005–1.030)
Squamous Epithelial / HPF: NONE SEEN (ref 0–5)
pH: 7 (ref 5.0–8.0)

## 2020-02-04 LAB — CBC WITH DIFFERENTIAL/PLATELET
Abs Immature Granulocytes: 0.03 10*3/uL (ref 0.00–0.07)
Basophils Absolute: 0 10*3/uL (ref 0.0–0.1)
Basophils Relative: 0 %
Eosinophils Absolute: 0 10*3/uL (ref 0.0–0.5)
Eosinophils Relative: 0 %
HCT: 49.4 % (ref 39.0–52.0)
Hemoglobin: 16.7 g/dL (ref 13.0–17.0)
Immature Granulocytes: 0 %
Lymphocytes Relative: 11 %
Lymphs Abs: 1.2 10*3/uL (ref 0.7–4.0)
MCH: 29.5 pg (ref 26.0–34.0)
MCHC: 33.8 g/dL (ref 30.0–36.0)
MCV: 87.1 fL (ref 80.0–100.0)
Monocytes Absolute: 1.2 10*3/uL — ABNORMAL HIGH (ref 0.1–1.0)
Monocytes Relative: 10 %
Neutro Abs: 9 10*3/uL — ABNORMAL HIGH (ref 1.7–7.7)
Neutrophils Relative %: 79 %
Platelets: 217 10*3/uL (ref 150–400)
RBC: 5.67 MIL/uL (ref 4.22–5.81)
RDW: 13.2 % (ref 11.5–15.5)
WBC: 11.5 10*3/uL — ABNORMAL HIGH (ref 4.0–10.5)
nRBC: 0 % (ref 0.0–0.2)

## 2020-02-04 LAB — COMPREHENSIVE METABOLIC PANEL
ALT: 29 U/L (ref 0–44)
AST: 28 U/L (ref 15–41)
Albumin: 4.1 g/dL (ref 3.5–5.0)
Alkaline Phosphatase: 95 U/L (ref 38–126)
Anion gap: 9 (ref 5–15)
BUN: 18 mg/dL (ref 6–20)
CO2: 28 mmol/L (ref 22–32)
Calcium: 8.7 mg/dL — ABNORMAL LOW (ref 8.9–10.3)
Chloride: 101 mmol/L (ref 98–111)
Creatinine, Ser: 1.43 mg/dL — ABNORMAL HIGH (ref 0.61–1.24)
GFR, Estimated: 57 mL/min — ABNORMAL LOW (ref >60)
Glucose, Bld: 110 mg/dL — ABNORMAL HIGH (ref 70–99)
Potassium: 3.8 mmol/L (ref 3.5–5.1)
Sodium: 138 mmol/L (ref 135–145)
Total Bilirubin: 0.8 mg/dL (ref 0.3–1.2)
Total Protein: 7.3 g/dL (ref 6.5–8.1)

## 2020-02-04 NOTE — ED Triage Notes (Signed)
Patient c/o mid abdominal pain that started yesterday.  Patient states that he vomitted once yesterday.  Patient denies N/D.  Patient denies fevers.

## 2020-02-04 NOTE — ED Triage Notes (Signed)
Patient is being discharged from the Urgent Care and sent to the [facility] Emergency Department via private vehicle . Per Christene Slates, PA, patient is in need of higher level of care due to results of x-ray. Patient is aware and verbalizes understanding of plan of care.  Vitals:   02/04/20 1147  BP: (!) 146/108  Pulse: 73  Resp: 16  Temp: 98.4 F (36.9 C)  SpO2: 96%

## 2020-02-04 NOTE — ED Provider Notes (Signed)
[facility]    CSN: [id number] Arrival date & time: 02/04/20  1045      History   Chief Complaint Chief Complaint  Patient presents with  . Abdominal Pain    HPI Joseph Dalton is a 59 y.o. male presenting for lower abdominal pain since yesterday.  He says the pain was really bad overnight and was up to 10 out of 10.  He says pain is currently 2-3 out of 10.  Pain is associated with reduced appetite and 1 episode of vomiting.  He denies any fevers.  He states that he normally has a bowel movement every day and has not had 1 since yesterday.  He denies any diarrhea.  No blood in stool.  Denies any urinary frequency, urgency or dysuria.  No testicular pain, flank pain, or back pain.  He has taken Tylenol, ibuprofen and Mylanta with little relief in symptoms.  He admits to belching a lot.  Denies any history of acid reflux.  Patient denies eating any spoiled food.  He denies any recent illness or cold symptoms.  Denies cough, congestion, chest pain or shortness of breath.  Denies any sick contacts.  No known Covid exposure.  Patient fully vaccinated for Covid.  He has no other complaints or concerns.  HPI  Past Medical History:  Diagnosis Date  . HTN (hypertension)   . Hyperlipemia   . Venous insufficiency     Patient Active Problem List   Diagnosis Date Noted  . Pain of left breast 01/06/2020  . Insomnia 12/31/2018  . Nocturnal cough 12/31/2018  . Impaired fasting glucose 12/31/2018  . Sciatica of left side associated with disorder of lumbar spine 05/11/2015  . Routine general medical examination at a health care facility 11/22/2013  . Obesity (BMI 30-39.9) 11/22/2013  . Hyperlipidemia 05/30/2011  . White coat syndrome with diagnosis of hypertension 05/30/2011    Past Surgical History:  Procedure Laterality Date  . COLONOSCOPY WITH PROPOFOL N/A 06/15/2018   Procedure: COLONOSCOPY WITH PROPOFOL;  Surgeon: Manya Silvas, MD;  Location: [facility] ENDOSCOPY;  Service:  Endoscopy;  Laterality: N/A;  . NO PAST SURGERIES         Home Medications    Prior to Admission medications   Medication Sig Start Date End Date Taking? Authorizing Provider  atorvastatin (LIPITOR) 20 MG tablet TAKE ONE TABLET BY MOUTH DAILY AT BEDTIME. 10/07/19  Yes [name], MD  etodolac (LODINE) 500 MG tablet Take 500 mg by mouth 2 (two) times daily.   Yes [provider]  Multiple Vitamins-Minerals (MULTIVITAMIN WITH MINERALS) tablet Take 1 tablet by mouth daily.   Yes [provider]  triamterene-hydrochlorothiazide (MAXZIDE-25) 37.5-25 MG tablet TAKE ONE (1) TABLET BY MOUTH ONCE DAILY 11/29/19  Yes [name], MD  amLODipine (NORVASC) 10 MG tablet TAKE (1) TABLET BY MOUTH DAILY AT BEDTIME 10/07/19   [name], MD    Family History Family History  Problem Relation Age of Onset  . Stroke Mother   . Hyperlipidemia Mother   . Heart disease Father 47       heart attack    Social History Social History   Tobacco Use  . Smoking status: Never Smoker  . Smokeless tobacco: Current User    Types: Chew  Vaping Use  . Vaping Use: Never used  Substance Use Topics  . Alcohol use: Yes    Alcohol/week: 1.0 standard drink    Types: 1 Cans of beer per week  Comment: Occasional  . Drug use: No     Allergies   Patient has no known allergies.   Review of Systems Review of Systems  Constitutional: Positive for appetite change. Negative for fatigue and fever.  HENT: Negative for congestion, rhinorrhea, sinus pressure, sinus pain and sore throat.   Respiratory: Negative for cough and shortness of breath.   Cardiovascular: Negative for chest pain.  Gastrointestinal: Positive for abdominal pain and vomiting. Negative for blood in stool, constipation, diarrhea and nausea.  Genitourinary: Negative for difficulty urinating, dysuria, frequency, hematuria and testicular pain.  Musculoskeletal: Negative for back pain and myalgias.  Neurological:  Negative for weakness, light-headedness and headaches.  Hematological: Negative for adenopathy.     Physical Exam Triage Vital Signs ED Triage Vitals  Enc Vitals Group     BP 02/04/20 1147 (!) 146/108     Pulse Rate 02/04/20 1147 73     Resp 02/04/20 1147 16     Temp 02/04/20 1147 98.4 F (36.9 C)     Temp Source 02/04/20 1147 Oral     SpO2 02/04/20 1147 96 %     Weight 02/04/20 1145 249 lb (112.9 kg)     Height 02/04/20 1145 6\' 2"  (1.88 m)     Head Circumference --      Peak Flow --      Pain Score 02/04/20 1145 4     Pain Loc --      Pain Edu? --      Excl. in [place]? --    No data found.  Updated Vital Signs BP (!) 146/108 (BP Location: Right Arm)   Pulse 73   Temp 98.4 F (36.9 C) (Oral)   Resp 16   Ht 6\' 2"  (1.88 m)   Wt 249 lb (112.9 kg)   SpO2 96%   BMI 31.97 kg/m       Physical Exam Vitals and nursing note reviewed.  Constitutional:      General: He is not in acute distress.    Appearance: Normal appearance. He is well-developed. He is not ill-appearing or toxic-appearing.  HENT:     Head: Normocephalic and atraumatic.  Eyes:     General: No scleral icterus.    Conjunctiva/sclera: Conjunctivae normal.  Cardiovascular:     Rate and Rhythm: Normal rate and regular rhythm.     Heart sounds: Normal heart sounds.  Pulmonary:     Effort: Pulmonary effort is normal. No respiratory distress.     Breath sounds: Normal breath sounds.  Abdominal:     General: Bowel sounds are normal. There is no distension.     Palpations: Abdomen is soft.     Tenderness: There is abdominal tenderness (very mild TTP LLQ, suprapubic). There is no right CVA tenderness, left CVA tenderness or guarding.     Comments: Negative Psoas and obturator. Negative Murphy's sign  Musculoskeletal:     Cervical back: Neck supple.  Skin:    General: Skin is warm and dry.  Neurological:     General: No focal deficit present.     Mental Status: He is alert. Mental status is at baseline.      Motor: No weakness.     Gait: Gait normal.  Psychiatric:        Mood and Affect: Mood normal.        Behavior: Behavior normal.        Thought Content: Thought content normal.      UC Treatments / Results  Labs (all  labs ordered are listed, but only abnormal results are displayed) Labs Reviewed  URINALYSIS, COMPLETE (UACMP) WITH MICROSCOPIC - Abnormal; Notable for the following components:      Result Value   Hgb urine dipstick SMALL (*)    All other components within normal limits  COMPREHENSIVE METABOLIC PANEL - Abnormal; Notable for the following components:   Glucose, Bld 110 (*)    Creatinine, Ser 1.43 (*)    Calcium 8.7 (*)    GFR, Estimated 57 (*)    All other components within normal limits  CBC WITH DIFFERENTIAL/PLATELET - Abnormal; Notable for the following components:   WBC 11.5 (*)    Neutro Abs 9.0 (*)    Monocytes Absolute 1.2 (*)    All other components within normal limits    EKG   Radiology DG Abdomen 1 View  Result Date: 02/04/2020 CLINICAL DATA:  Lower abdominal pain. EXAM: ABDOMEN - 1 VIEW COMPARISON:  Lumbar spine 05/11/2015. FINDINGS: Soft tissue structures are unremarkable. Slightly prominent loops of small large bowel noted. Mild adynamic ileus could present this fashion. No free air. Stable left calcific densities in the left kidney consistent with nephrolithiasis. Stable calcific density noted the right kidney suggesting nephrolithiasis. Multiple pelvic calcifications noted most likely phleboliths. Degenerative changes lumbar spine and both hips. IMPRESSION: 1. Slightly prominent loops of small and large bowel noted. Mild adynamic ileus could present in this fashion. 2. Stable calcific densities noted the kidneys suggesting nephrolithiasis. Electronically Signed   By: Marcello Moores  Register   On: 02/04/2020 13:29    Procedures Procedures (including critical care time)  Medications Ordered in UC Medications - No data to display  Initial Impression /  Assessment and Plan / UC Course  I have reviewed the triage vital signs and the nursing notes.  Pertinent labs & imaging results that were available during my care of the patient were reviewed by me and considered in my medical decision making (see chart for details).   59 year old male presenting for lower abdominal pain since yesterday.  He admits that the intensity ranges from severe to mild to moderate.  Pain is not too bad at this time, but he is still concerned.  Vital signs are stable.  Blood pressure is elevated at 146/108.  He does have tenderness of the suprapubic and left lower quadrant regions.    Labs performed today including CBC and CMP.  Urinalysis also performed.  CBC shows elevated white blood cell count 11.5 with left shift.  Urinalysis with small hemoglobin.  Compared CBC today to the one obtained 1 month ago.  Renal function 1 month ago was within normal limits.  Today he does show an elevated creatinine at 1.43 and GFR reduced at 57.  No history of renal disease according to patient.  KUB obtained today which shows 1. Slightly prominent loops of small and large bowel noted. Mild adynamic ileus could present in this fashion. 2. Stable calcific densities noted the kidneys suggesting nephrolithiasis.  DDx does include muscle obstructing renal stones, small bowel obstruction or ileus, intestinal infection, viral illness, appendicitis, dehydration, acute kidney injury.  At this time based on patient's findings, I am sending patient to emergency department for evaluation at this time.  His wife plans to take him and he is leaving in stable condition at this time.  They declined any EMS transport.   Final Clinical Impressions(s) / UC Diagnoses   Final diagnoses:  Lower abdominal pain  Non-intractable vomiting with nausea, unspecified vomiting type  Acute kidney  injury [facility]     Discharge Instructions     You have been advised to follow up immediately in the emergency  department for concerning signs.symptoms. If you declined EMS transport, please have a family member take you directly to the ED at this time. Do not delay. Based on concerns about condition, if you do not follow up in th e ED, you may risk poor outcomes including worsening of condition, delayed treatment and potentially life threatening issues. If you have declined to go to the ED at this time, you should call your PCP immediately to set up a follow up appointment.  Go to ED for red flag symptoms, including; fevers you cannot reduce with Tylenol/Motrin, severe headaches, vision changes, numbness/weakness in part of the body, lethargy, confusion, intractable vomiting, severe dehydration, chest pain, breathing difficulty, severe persistent abdominal or pelvic pain, signs of severe infection (increased redness, swelling of an area), feeling faint or passing out, dizziness, etc. You should especially go to the ED for sudden acute worsening of condition if you do not elect to go at this time.     ED Prescriptions    None     PDMP not reviewed this encounter.   Danton Clap, PA-C 02/04/20 1519

## 2020-02-04 NOTE — Discharge Instructions (Signed)

## 2020-02-07 ENCOUNTER — Ambulatory Visit
Admission: RE | Admit: 2020-02-07 | Discharge: 2020-02-07 | Disposition: A | Discharge status: Home / Self Care | Payer: BC Managed Care – PPO | Source: Ambulatory Visit | Attending: Internal Medicine | Admitting: Internal Medicine

## 2020-02-07 ENCOUNTER — Other Ambulatory Visit: Payer: Self-pay

## 2020-02-07 DIAGNOSIS — N62 Hypertrophy of breast: Secondary | ICD-10-CM | POA: Diagnosis present

## 2020-02-07 DIAGNOSIS — N644 Mastodynia: Secondary | ICD-10-CM

## 2020-02-07 NOTE — Progress Notes (Signed)
Your mammogram showed no masses,  but did confirm that the left breast asa enlarged.  Given your elevated prolactin level ,  this may be due to a pituitary tumor that making too much of this hormone,  s o the MRI of the brain is needed

## 2020-02-07 NOTE — Assessment & Plan Note (Signed)
Left breast,  By diagnostic mammogram.  Prolactin level is elevated.  MRI brain ordered

## 2020-02-08 NOTE — H&P (View-Only) (Signed)
02/09/2020 11:10 AM   Joseph Dalton September 16, 1960 [id number]  Referring provider: [name], MD Joseph Dalton,  Joseph Dalton [id number] Chief Complaint  Patient presents with  . Nephrolithiasis    HPI: Joseph Dalton is a 59 y.o. male who presents today for evaluation and management of kidney stones.   Patient was evaluated at the ED on 02/04/2020 for abdominal pain. Pain was a 10/10 the night before and during the visit his pain was a 3/10. He had taken Tylenol, ibuprofen and Mylanta with little relief in symptoms. He had associated reduced appetite and 1 episode of vomiting. No fevers. His bowels were normal. No flank or back pain.   UA showed small blood on dipstick and 11-20 RBC's, otherwise negative. KUB showed slightly prominent loops of small and large bowel noted. Mild adynamic ileus could present in this fashion. Stable calcific densities noted the kidneys suggesting nephrolithiasis.  CT A/P w/ contrast at [facility] noted obstructing 0.5 cm ureterolithiasis within theproximal right ureter resulting in mild right hydroureteronephrosis and decreased enhancement of the right kidney. Correlation with urinalysis to exclude underlying infection. Other chronic/incidental findings as above.  Denies history of kidney stones. He took pain medication Sunday which did not go into until Monday. Yesterday after dinner has some flank pain. He took pain medication to relieve pain last night. He states he is not in pain today. Pain has radiated from lower abdomen and flank. He has not strained his urine. Denies burning or hematuria. No fevers or chills.  KUB today compared to KUB on 02/04/2020 shows two large stones in the left kidney measuring 1 cm each. Right kidney stone is still retained just below the L3 transfer process and ~2 cm below the renal shadow.  These images were personally reviewed today.  PMH: Past Medical History:  Diagnosis Date  . HTN (hypertension)   .  Hyperlipemia   . Venous insufficiency     Surgical History: Past Surgical History:  Procedure Laterality Date  . COLONOSCOPY WITH PROPOFOL N/A 06/15/2018   Procedure: COLONOSCOPY WITH PROPOFOL;  Surgeon: Manya Silvas, MD;  Location: [facility] ENDOSCOPY;  Service: Endoscopy;  Laterality: N/A;  . NO PAST SURGERIES      Home Medications:  Allergies as of 02/09/2020   No Known Allergies     Medication List       Accurate as of February 09, 2020 11:10 AM. If you have any questions, ask your nurse or doctor.        amLODipine 10 MG tablet Commonly known as: NORVASC TAKE (1) TABLET BY MOUTH DAILY AT BEDTIME   atorvastatin 20 MG tablet Commonly known as: LIPITOR TAKE ONE TABLET BY MOUTH DAILY AT BEDTIME.   cephALEXin 500 MG capsule Commonly known as: KEFLEX Take 500 mg by mouth 3 (three) times daily.   etodolac 500 MG tablet Commonly known as: LODINE Take 500 mg by mouth 2 (two) times daily.   ketorolac 10 MG tablet Commonly known as: TORADOL Take 10 mg by mouth every 6 (six) hours as needed.   multivitamin with minerals tablet Take 1 tablet by mouth daily.   tamsulosin 0.4 MG Caps capsule Commonly known as: FLOMAX Take 0.4 mg by mouth daily.   triamterene-hydrochlorothiazide 37.5-25 MG tablet Commonly known as: MAXZIDE-25 TAKE ONE (1) TABLET BY MOUTH ONCE DAILY       Allergies: No Known Allergies  Family History: Family History  Problem Relation Age of Onset  . Stroke Mother   .  Hyperlipidemia Mother   . Heart disease Father 71       heart attack  . Breast cancer Neg Hx     Social History:  reports that he has never smoked. His smokeless tobacco use includes chew. He reports current alcohol use of about 1.0 standard drink of alcohol per week. He reports that he does not use drugs.   Physical Exam: BP (!) 149/94   Pulse 74   Wt 249 lb (112.9 kg)   BMI 31.97 kg/m   Constitutional:  Alert and oriented, No acute distress. HEENT: [place] AT, moist mucus  membranes.  Trachea midline, no masses. Cardiovascular: No clubbing, cyanosis, or edema. Respiratory: Normal respiratory effort, no increased work of breathing. Skin: No rashes, bruises or suspicious lesions. Neurologic: Grossly intact, no focal deficits, moving all 4 extremities. Psychiatric: Normal mood and affect.  Laboratory Data:  Lab Results  Component Value Date   CREATININE 1.43 (H) 02/04/2020    Lab Results  Component Value Date   PSA 1.97 01/05/2020   PSA 1.15 12/31/2018   PSA 1.10 12/19/2017    Lab Results  Component Value Date   HGBA1C 5.4 01/05/2020    Urinalysis Negative  Pertinent Imaging: KUB today  Results for orders placed during the hospital encounter of 02/04/20  DG Abdomen 1 View  Narrative CLINICAL DATA:  Lower abdominal pain.  EXAM: ABDOMEN - 1 VIEW  COMPARISON:  Lumbar spine 05/11/2015.  FINDINGS: Soft tissue structures are unremarkable. Slightly prominent loops of small large bowel noted. Mild adynamic ileus could present this fashion. No free air. Stable left calcific densities in the left kidney consistent with nephrolithiasis. Stable calcific density noted the right kidney suggesting nephrolithiasis. Multiple pelvic calcifications noted most likely phleboliths. Degenerative changes lumbar spine and both hips.  IMPRESSION: 1. Slightly prominent loops of small and large bowel noted. Mild adynamic ileus could present in this fashion. 2. Stable calcific densities noted the kidneys suggesting nephrolithiasis.   Electronically Signed By: Marcello Moores  Register On: 02/04/2020 13:29  Interface, Rad Results In - 02/04/2020 9:04 PM EDT  Formatting of this note might be different from the original.  EXAM: CT ABDOMEN PELVIS W CONTRAST  DATE: 02/04/2020 8:20 PM  ACCESSION: [id number]  DICTATED: 02/04/2020 8:28 PM  INTERPRETATION LOCATION: [place]: abdominal pain ; Abdominal pain, acute, nonlocalized     COMPARISON: None   TECHNIQUE: A spiral CT scan of the abdomen and pelvis was obtained with IV contrast from the lung bases through the pubic symphysis. Images were reconstructed in the axial plane. Coronal and sagittal reformatted images were also provided for further evaluation.   FINDINGS:   LINES AND TUBES: None.   LOWER THORAX: Linear opacities in the dependent bilateral lobes, right greater than left, likely atelectasis. Fat-containing subpleural lesion in the right lower lobe, likely lipoma (2:52).Marland Kitchen   HEPATOBILIARY: Scattered subcentimeter focal hypoattenuating lesions throughout the liver, too small to characterize. The gallbladder is presentand otherwise unremarkable. No biliary dilatation.   SPLEEN: Unremarkable.  PANCREAS: Unremarkable.   ADRENALS: Unremarkable.  KIDNEYS/URETERS: Decreased enhancement of the right kidney with mild hydronephrosis. Normal enhancement of the left kidney. Mildly obstructing 0.5 cm calculus within the right proximal ureter (2:88). Additional, nonobstructing bilateral renal calculi, largest of which measures 1.2 cm in the left lower kidney. No left-sided hydronephrosis. Multiple bilateral renal lesions withfluid attenuation, most likely renal cysts. The largest of these is an exophytic lesion originating from the right lower kidney and measures 3.8 x 2.7 cm (  4:72). Mild bilateral perinephric fat stranding.   BLADDER: Not fully distended, limiting evaluation. Subtle stranding along the anterior bladder wall.  PELVIC/REPRODUCTIVE ORGANS: Unremarkable.   GI TRACT: No dilated or thick walled loops of bowel. Normal appendix.   PERITONEUM/RETROPERITONEUM AND MESENTERY: Trace free fluid in the right pelvic sidewall. No free air.  LYMPH NODES: No enlarged lymph nodes.  VESSELS: The aorta is normal in caliber. No significant calcified atherosclerotic disease. The portal venous system is patent. The hepatic veins and IVC are unremarkable.   BONES AND SOFT  TISSUES: Bilateral fat-containing inguinal hernias. Bilateral gynecomastia, left greater than right. Degenerative changes of the imaged thoracolumbar spine. No aggressive osseous lesions.    IMPRESSION:  1. Obstructing 0.5 cm ureterolithiasis within theproximal right ureter resulting in mild right hydroureteronephrosis and decreased enhancement of the right kidney. Correlation with urinalysis to exclude underlying infection.   2. Other chronic/incidental findings as above.  I have personally reviewed the images and agree with radiologist interpretation. Able to view proximal stone in question on KUB.    Assessment & Plan:    1. Right kidney stone  5-6 mm right stone with slight progression to the mid ureter on KUB today, continues to have intermittent pain  We discussed that statistically, he is a reasonable chance of passing this spontaneously.  That being said, he is been having intermittent pain and is worried about missing work.  He prefers to consider intervention.  We discussed various treatment options including ESWL vs. ureteroscopy, laser lithotripsy, and stent. We discussed the risks and benefits of both including bleeding, infection, damage to surrounding structures, efficacy with need for possible further intervention, and need for temporary ureteral stent.   He is most interested in lithotripsy.  Given the location of the stone, is just around 2 cm below the renal shadow.  He understands that if he were to pursue shockwave tomorrow, there is a chance that we might get down to the truck and realize that it in fact is just within the renal shadow and a chance that it may need to be canceled or deferred as he has been taking Toradol.  He like to hold off and continue spontaneous passage for the next week and tentatively schedule lithotripsy for the following.  He will hold his Toradol 72 hours prior to the procedure.  Strain urine.  Preop UCx  Waring symptoms reviewed.  2.  Left kidney stones KUB today shows two large stones in the left kidney measuring 1 cm each.  Will continue to monitor-is not interested in treating these prophylactically but may want to follow them to ensure that they are not enlarging, will plan for KUB in 6 months  We discussed general stone prevention techniques including drinking plenty water with goal of producing 2.5 L urine daily, increased citric acid intake, avoidance of high oxalate containing foods, and decreased salt intake.  Information about dietary recommendations given today.    [facility] [address], [place] [employer], [place] [address] [phone]  I, Selena Batten, am acting as a scribe for Dr. Hollice Espy.  I have reviewed the above documentation for accuracy and completeness, and I agree with the above.   Hollice Espy, MD

## 2020-02-08 NOTE — Progress Notes (Signed)
02/09/2020 11:10 AM   Joseph Dalton 1960-07-18 [id number]  Referring provider: [name], MD [place],  [place] [address] Chief Complaint  Patient presents with  . Nephrolithiasis    HPI: Joseph Dalton is a 59 y.o. male who presents today for evaluation and management of kidney stones.   Patient was evaluated at the ED on 02/04/2020 for abdominal pain. Pain was a 10/10 the night before and during the visit his pain was a 3/10. He had taken Tylenol, ibuprofen and Mylanta with little relief in symptoms. He had associated reduced appetite and 1 episode of vomiting. No fevers. His bowels were normal. No flank or back pain.   UA showed small blood on dipstick and 11-20 RBC's, otherwise negative. KUB showed slightly prominent loops of small and large bowel noted. Mild adynamic ileus could present in this fashion. Stable calcific densities noted the kidneys suggesting nephrolithiasis.  CT A/P w/ contrast at [facility] noted obstructing 0.5 cm ureterolithiasis within theproximal right ureter resulting in mild right hydroureteronephrosis and decreased enhancement of the right kidney. Correlation with urinalysis to exclude underlying infection. Other chronic/incidental findings as above.  Denies history of kidney stones. He took pain medication Sunday which did not go into until Monday. Yesterday after dinner has some flank pain. He took pain medication to relieve pain last night. He states he is not in pain today. Pain has radiated from lower abdomen and flank. He has not strained his urine. Denies burning or hematuria. No fevers or chills.  KUB today compared to KUB on 02/04/2020 shows two large stones in the left kidney measuring 1 cm each. Right kidney stone is still retained just below the L3 transfer process and ~2 cm below the renal shadow.  These images were personally reviewed today.  PMH: Past Medical History:  Diagnosis Date  . HTN (hypertension)   .  Hyperlipemia   . Venous insufficiency     Surgical History: Past Surgical History:  Procedure Laterality Date  . COLONOSCOPY WITH PROPOFOL N/A 06/15/2018   Procedure: COLONOSCOPY WITH PROPOFOL;  Surgeon: Manya Silvas, MD;  Location: [facility] ENDOSCOPY;  Service: Endoscopy;  Laterality: N/A;  . NO PAST SURGERIES      Home Medications:  Allergies as of 02/09/2020   No Known Allergies     Medication List       Accurate as of February 09, 2020 11:10 AM. If you have any questions, ask your nurse or doctor.        amLODipine 10 MG tablet Commonly known as: NORVASC TAKE (1) TABLET BY MOUTH DAILY AT BEDTIME   atorvastatin 20 MG tablet Commonly known as: LIPITOR TAKE ONE TABLET BY MOUTH DAILY AT BEDTIME.   cephALEXin 500 MG capsule Commonly known as: KEFLEX Take 500 mg by mouth 3 (three) times daily.   etodolac 500 MG tablet Commonly known as: LODINE Take 500 mg by mouth 2 (two) times daily.   ketorolac 10 MG tablet Commonly known as: TORADOL Take 10 mg by mouth every 6 (six) hours as needed.   multivitamin with minerals tablet Take 1 tablet by mouth daily.   tamsulosin 0.4 MG Caps capsule Commonly known as: FLOMAX Take 0.4 mg by mouth daily.   triamterene-hydrochlorothiazide 37.5-25 MG tablet Commonly known as: MAXZIDE-25 TAKE ONE (1) TABLET BY MOUTH ONCE DAILY       Allergies: No Known Allergies  Family History: Family History  Problem Relation Age of Onset  . Stroke Mother   .  Hyperlipidemia Mother   . Heart disease Father 71       heart attack  . Breast cancer Neg Hx     Social History:  reports that he has never smoked. His smokeless tobacco use includes chew. He reports current alcohol use of about 1.0 standard drink of alcohol per week. He reports that he does not use drugs.   Physical Exam: BP (!) 149/94   Pulse 74   Wt 249 lb (112.9 kg)   BMI 31.97 kg/m   Constitutional:  Alert and oriented, No acute distress. HEENT: [place] AT, moist mucus  membranes.  Trachea midline, no masses. Cardiovascular: No clubbing, cyanosis, or edema. Respiratory: Normal respiratory effort, no increased work of breathing. Skin: No rashes, bruises or suspicious lesions. Neurologic: Grossly intact, no focal deficits, moving all 4 extremities. Psychiatric: Normal mood and affect.  Laboratory Data:  Lab Results  Component Value Date   CREATININE 1.43 (H) 02/04/2020    Lab Results  Component Value Date   PSA 1.97 01/05/2020   PSA 1.15 12/31/2018   PSA 1.10 12/19/2017    Lab Results  Component Value Date   HGBA1C 5.4 01/05/2020    Urinalysis Negative  Pertinent Imaging: KUB today  Results for orders placed during the hospital encounter of 02/04/20  DG Abdomen 1 View  Narrative CLINICAL DATA:  Lower abdominal pain.  EXAM: ABDOMEN - 1 VIEW  COMPARISON:  Lumbar spine 05/11/2015.  FINDINGS: Soft tissue structures are unremarkable. Slightly prominent loops of small large bowel noted. Mild adynamic ileus could present this fashion. No free air. Stable left calcific densities in the left kidney consistent with nephrolithiasis. Stable calcific density noted the right kidney suggesting nephrolithiasis. Multiple pelvic calcifications noted most likely phleboliths. Degenerative changes lumbar spine and both hips.  IMPRESSION: 1. Slightly prominent loops of small and large bowel noted. Mild adynamic ileus could present in this fashion. 2. Stable calcific densities noted the kidneys suggesting nephrolithiasis.   Electronically Signed By: Marcello Moores  Register On: 02/04/2020 13:29  Interface, Rad Results In - 02/04/2020 9:04 PM EDT  Formatting of this note might be different from the original.  EXAM: CT ABDOMEN PELVIS W CONTRAST  DATE: 02/04/2020 8:20 PM  ACCESSION: [id number]  DICTATED: 02/04/2020 8:28 PM  INTERPRETATION LOCATION: [place]: abdominal pain ; Abdominal pain, acute, nonlocalized     COMPARISON: None   TECHNIQUE: A spiral CT scan of the abdomen and pelvis was obtained with IV contrast from the lung bases through the pubic symphysis. Images were reconstructed in the axial plane. Coronal and sagittal reformatted images were also provided for further evaluation.   FINDINGS:   LINES AND TUBES: None.   LOWER THORAX: Linear opacities in the dependent bilateral lobes, right greater than left, likely atelectasis. Fat-containing subpleural lesion in the right lower lobe, likely lipoma (2:52).Marland Kitchen   HEPATOBILIARY: Scattered subcentimeter focal hypoattenuating lesions throughout the liver, too small to characterize. The gallbladder is presentand otherwise unremarkable. No biliary dilatation.   SPLEEN: Unremarkable.  PANCREAS: Unremarkable.   ADRENALS: Unremarkable.  KIDNEYS/URETERS: Decreased enhancement of the right kidney with mild hydronephrosis. Normal enhancement of the left kidney. Mildly obstructing 0.5 cm calculus within the right proximal ureter (2:88). Additional, nonobstructing bilateral renal calculi, largest of which measures 1.2 cm in the left lower kidney. No left-sided hydronephrosis. Multiple bilateral renal lesions withfluid attenuation, most likely renal cysts. The largest of these is an exophytic lesion originating from the right lower kidney and measures 3.8 x 2.7 cm (  4:72). Mild bilateral perinephric fat stranding.   BLADDER: Not fully distended, limiting evaluation. Subtle stranding along the anterior bladder wall.  PELVIC/REPRODUCTIVE ORGANS: Unremarkable.   GI TRACT: No dilated or thick walled loops of bowel. Normal appendix.   PERITONEUM/RETROPERITONEUM AND MESENTERY: Trace free fluid in the right pelvic sidewall. No free air.  LYMPH NODES: No enlarged lymph nodes.  VESSELS: The aorta is normal in caliber. No significant calcified atherosclerotic disease. The portal venous system is patent. The hepatic veins and IVC are unremarkable.   BONES AND SOFT  TISSUES: Bilateral fat-containing inguinal hernias. Bilateral gynecomastia, left greater than right. Degenerative changes of the imaged thoracolumbar spine. No aggressive osseous lesions.    IMPRESSION:  1. Obstructing 0.5 cm ureterolithiasis within theproximal right ureter resulting in mild right hydroureteronephrosis and decreased enhancement of the right kidney. Correlation with urinalysis to exclude underlying infection.   2. Other chronic/incidental findings as above.  I have personally reviewed the images and agree with radiologist interpretation. Able to view proximal stone in question on KUB.    Assessment & Plan:    1. Right kidney stone  5-6 mm right stone with slight progression to the mid ureter on KUB today, continues to have intermittent pain  We discussed that statistically, he is a reasonable chance of passing this spontaneously.  That being said, he is been having intermittent pain and is worried about missing work.  He prefers to consider intervention.  We discussed various treatment options including ESWL vs. ureteroscopy, laser lithotripsy, and stent. We discussed the risks and benefits of both including bleeding, infection, damage to surrounding structures, efficacy with need for possible further intervention, and need for temporary ureteral stent.   He is most interested in lithotripsy.  Given the location of the stone, is just around 2 cm below the renal shadow.  He understands that if he were to pursue shockwave tomorrow, there is a chance that we might get down to the truck and realize that it in fact is just within the renal shadow and a chance that it may need to be canceled or deferred as he has been taking Toradol.  He like to hold off and continue spontaneous passage for the next week and tentatively schedule lithotripsy for the following.  He will hold his Toradol 72 hours prior to the procedure.  Strain urine.  Preop UCx  Waring symptoms reviewed.  2.  Left kidney stones KUB today shows two large stones in the left kidney measuring 1 cm each.  Will continue to monitor-is not interested in treating these prophylactically but may want to follow them to ensure that they are not enlarging, will plan for KUB in 6 months  We discussed general stone prevention techniques including drinking plenty water with goal of producing 2.5 L urine daily, increased citric acid intake, avoidance of high oxalate containing foods, and decreased salt intake.  Information about dietary recommendations given today.    [place] [address], [place], [place] [address] [phone]  I, Selena Batten, am acting as a scribe for Dr. Hollice Espy.  I have reviewed the above documentation for accuracy and completeness, and I agree with the above.   Hollice Espy, MD

## 2020-02-09 ENCOUNTER — Other Ambulatory Visit: Payer: Self-pay

## 2020-02-09 ENCOUNTER — Ambulatory Visit
Admission: RE | Admit: 2020-02-09 | Discharge: 2020-02-09 | Disposition: A | Discharge status: Home / Self Care | Payer: BC Managed Care – PPO | Source: Ambulatory Visit | Attending: Urology | Admitting: Urology

## 2020-02-09 ENCOUNTER — Other Ambulatory Visit: Payer: Self-pay | Admitting: Radiology

## 2020-02-09 ENCOUNTER — Ambulatory Visit (INDEPENDENT_AMBULATORY_CARE_PROVIDER_SITE_OTHER): Payer: BC Managed Care – PPO | Admitting: Urology

## 2020-02-09 VITALS — BP 149/94 | HR 74 | Wt 249.0 lb

## 2020-02-09 DIAGNOSIS — N200 Calculus of kidney: Secondary | ICD-10-CM | POA: Diagnosis not present

## 2020-02-10 ENCOUNTER — Ambulatory Visit
Admission: RE | Admit: 2020-02-10 | Discharge: 2020-02-10 | Disposition: A | Discharge status: Home / Self Care | Payer: BC Managed Care – PPO | Source: Ambulatory Visit | Attending: Internal Medicine | Admitting: Internal Medicine

## 2020-02-10 ENCOUNTER — Other Ambulatory Visit: Payer: Self-pay | Admitting: Internal Medicine

## 2020-02-10 DIAGNOSIS — R7989 Other specified abnormal findings of blood chemistry: Secondary | ICD-10-CM | POA: Diagnosis present

## 2020-02-10 DIAGNOSIS — N62 Hypertrophy of breast: Secondary | ICD-10-CM

## 2020-02-10 DIAGNOSIS — N644 Mastodynia: Secondary | ICD-10-CM | POA: Diagnosis present

## 2020-02-10 LAB — URINALYSIS, COMPLETE
Bilirubin, UA: NEGATIVE
Glucose, UA: NEGATIVE
Ketones, UA: NEGATIVE
Leukocytes,UA: NEGATIVE
Nitrite, UA: NEGATIVE
Protein,UA: NEGATIVE
Specific Gravity, UA: 1.025 (ref 1.005–1.030)
Urobilinogen, Ur: 0.2 mg/dL (ref 0.2–1.0)
pH, UA: 6 (ref 5.0–7.5)

## 2020-02-10 LAB — MICROSCOPIC EXAMINATION: Bacteria, UA: NONE SEEN

## 2020-02-10 NOTE — Progress Notes (Signed)
Your MRI did not show any evidence of a  pituitary tumor (the part of the brain that produces prolactin),  current or prior  strokes,  or masses. The radiologist did say that a tiny pituitary tumor could have been missed if it was too small to show up on this scan, but before I order any more imaging I would prefer to have you see an endocrinologist (a internist that specializes in conditions caused by hormone imbalances)  for their opinion.  I will go ahead and start that referral to Dr Honor Junes at [facility] here in [place],   Deborra Medina, MD

## 2020-02-10 NOTE — Progress Notes (Signed)
Your MRI did not show any evidence of a  pituitary tumor (the part of the brain that produces prolactin),  current or prior  strokes,  or masses. The radiologist did say that a tiny pituitary tumor could have been missed if it was too small to show up on this scan, but before I order any more imaging I would prefer to have you see an endocrinologist (a internist that specializes in conditions caused by hormone imbalances)  for their opinion.  I will go ahead and start that referral to Dr Honor Junes at [facility] here in [place],   Deborra Medina, MD

## 2020-02-13 LAB — CULTURE, URINE COMPREHENSIVE

## 2020-02-15 ENCOUNTER — Other Ambulatory Visit: Admission: RE | Admit: 2020-02-15 | Payer: BC Managed Care – PPO | Source: Ambulatory Visit

## 2020-02-17 ENCOUNTER — Ambulatory Visit: Payer: BC Managed Care – PPO

## 2020-02-17 ENCOUNTER — Other Ambulatory Visit: Payer: Self-pay

## 2020-02-17 ENCOUNTER — Encounter: Payer: Self-pay | Admitting: Urology

## 2020-02-17 ENCOUNTER — Ambulatory Visit
Admission: RE | Admit: 2020-02-17 | Discharge: 2020-02-17 | Disposition: A | Discharge status: Home / Self Care | Payer: BC Managed Care – PPO | Attending: Urology | Admitting: Urology

## 2020-02-17 ENCOUNTER — Encounter
Admission: RE | Disposition: A | Discharge status: Home / Self Care | Payer: Self-pay | Source: Home / Self Care | Attending: Urology

## 2020-02-17 DIAGNOSIS — I10 Essential (primary) hypertension: Secondary | ICD-10-CM | POA: Insufficient documentation

## 2020-02-17 DIAGNOSIS — F17220 Nicotine dependence, chewing tobacco, uncomplicated: Secondary | ICD-10-CM | POA: Diagnosis not present

## 2020-02-17 DIAGNOSIS — N132 Hydronephrosis with renal and ureteral calculous obstruction: Secondary | ICD-10-CM | POA: Diagnosis not present

## 2020-02-17 DIAGNOSIS — Z79899 Other long term (current) drug therapy: Secondary | ICD-10-CM | POA: Insufficient documentation

## 2020-02-17 DIAGNOSIS — N200 Calculus of kidney: Secondary | ICD-10-CM

## 2020-02-17 DIAGNOSIS — N201 Calculus of ureter: Secondary | ICD-10-CM

## 2020-02-17 HISTORY — PX: EXTRACORPOREAL SHOCK WAVE LITHOTRIPSY: SHX1557

## 2020-02-17 SURGERY — LITHOTRIPSY, ESWL
Anesthesia: Moderate Sedation | Laterality: Right

## 2020-02-17 MED ORDER — DIPHENHYDRAMINE HCL 25 MG PO CAPS: 25.0000 mg | ORAL_CAPSULE | ORAL | Status: AC

## 2020-02-17 MED ORDER — DIAZEPAM 5 MG PO TABS: 10.0000 mg | ORAL_TABLET | ORAL | Status: AC

## 2020-02-17 MED ORDER — DIPHENHYDRAMINE HCL 25 MG PO CAPS
ORAL_CAPSULE | ORAL | Status: AC
  Administered 2020-02-17: 25 mg via ORAL
  Filled 2020-02-17: qty 1

## 2020-02-17 MED ORDER — SODIUM CHLORIDE 0.9 % IV SOLN: INTRAVENOUS | Status: DC

## 2020-02-17 MED ORDER — HYDROCODONE-ACETAMINOPHEN 5-325 MG PO TABS: 1.0000 | ORAL_TABLET | ORAL | 0 refills | Status: AC | PRN

## 2020-02-17 MED ORDER — DIAZEPAM 5 MG PO TABS
ORAL_TABLET | ORAL | Status: AC
  Administered 2020-02-17: 10 mg via ORAL
  Filled 2020-02-17: qty 2

## 2020-02-17 MED ORDER — CEPHALEXIN 500 MG PO CAPS
ORAL_CAPSULE | ORAL | Status: AC
  Administered 2020-02-17: 500 mg via ORAL
  Filled 2020-02-17: qty 1

## 2020-02-17 MED ORDER — CEPHALEXIN 500 MG PO CAPS: 500.0000 mg | ORAL_CAPSULE | ORAL | Status: AC

## 2020-02-17 NOTE — Interval H&P Note (Signed)
UROLOGY H&P UPDATE  Agree with prior H&P dated 02/09/20 by Dr Erlene Quan. 70mm right proximal ureteral stone.  Cardiac: RRR Lungs: CTA bilaterally  Laterality: right Procedure: shockwave lithotripsy  Urine: culture 11/3 no growth  Informed consent obtained, we specifically discussed the risks of bleeding/hematoma, steinstrasse/obstructive fragments, infection, post-operative pain, need for additional procedures.  Joseph Co, MD 02/17/2020

## 2020-02-17 NOTE — Discharge Instructions (Addendum)
AMBULATORY SURGERY  °DISCHARGE INSTRUCTIONS ° ° °1) The drugs that you were given will stay in your system until tomorrow so for the next 24 hours you should not: ° °A) Drive an automobile °B) Make any legal decisions °C) Drink any alcoholic beverage ° ° °2) You may resume regular meals tomorrow.  Today it is better to start with liquids and gradually work up to solid foods. ° °You may eat anything you prefer, but it is better to start with liquids, then soup and crackers, and gradually work up to solid foods. ° ° °3) Please notify your doctor immediately if you have any unusual bleeding, trouble breathing, redness and pain at the surgery site, drainage, fever, or pain not relieved by medication. ° ° ° °4) Additional Instructions:   FOLLOW [facility] INSTRUCTION SHEET AS REVIEWED. ° ° ° ° ° ° ° °Please contact your physician with any problems or Same Day Surgery at [phone], Monday through Friday 6 am to 4 pm, or [place] at [place] Main number at [phone]. °

## 2020-02-17 NOTE — Brief Op Note (Signed)
02/17/2020  8:34 AM  PATIENT:  Joseph Dalton  59 y.o. male  PRE-OPERATIVE DIAGNOSIS:  Right 41mm proximal ureteral stone  POST-OPERATIVE DIAGNOSIS:  Same  PROCEDURE:  Procedure(s): EXTRACORPOREAL SHOCK WAVE LITHOTRIPSY (ESWL) (Right)  SURGEON:  Surgeon(s) and Role:    * Billey Co, MD - Primary  ANESTHESIA: Conscious Sedation  EBL:  None  Drains: None  Specimen: None  Findings:  1. Uncomplicated SWL, excellent smudging of stone at conclusion  DISPO: Flomax, pain meds PRN, RTC 2 weeks KUB  Nickolas Madrid, MD 02/17/2020

## 2020-02-18 ENCOUNTER — Other Ambulatory Visit: Payer: Self-pay | Admitting: *Deleted

## 2020-02-18 MED ORDER — TAMSULOSIN HCL 0.4 MG PO CAPS
0.4000 mg | ORAL_CAPSULE | Freq: Every day | ORAL | 0 refills | Status: DC
Start: 2020-02-18 — End: 2020-03-08

## 2020-02-28 ENCOUNTER — Ambulatory Visit: Payer: BC Managed Care – PPO | Admitting: Urology

## 2020-02-28 ENCOUNTER — Ambulatory Visit
Admission: RE | Admit: 2020-02-28 | Discharge: 2020-02-28 | Disposition: A | Discharge status: Home / Self Care | Payer: BC Managed Care – PPO | Attending: Urology | Admitting: Urology

## 2020-02-28 ENCOUNTER — Other Ambulatory Visit: Payer: Self-pay

## 2020-02-28 ENCOUNTER — Telehealth: Payer: Self-pay | Admitting: Radiology

## 2020-02-28 ENCOUNTER — Other Ambulatory Visit
Admission: RE | Admit: 2020-02-28 | Discharge: 2020-02-28 | Disposition: A | Discharge status: Home / Self Care | Payer: BC Managed Care – PPO | Source: Home / Self Care | Attending: Urology | Admitting: Urology

## 2020-02-28 ENCOUNTER — Ambulatory Visit
Admission: RE | Admit: 2020-02-28 | Discharge: 2020-02-28 | Disposition: A | Discharge status: Home / Self Care | Payer: BC Managed Care – PPO | Source: Ambulatory Visit | Attending: Urology | Admitting: Urology

## 2020-02-28 ENCOUNTER — Encounter: Payer: Self-pay | Admitting: Urology

## 2020-02-28 VITALS — BP 149/106 | HR 80 | Ht 74.0 in | Wt 255.0 lb

## 2020-02-28 DIAGNOSIS — N200 Calculus of kidney: Secondary | ICD-10-CM

## 2020-02-28 DIAGNOSIS — R300 Dysuria: Secondary | ICD-10-CM

## 2020-02-28 DIAGNOSIS — R1030 Lower abdominal pain, unspecified: Secondary | ICD-10-CM | POA: Diagnosis present

## 2020-02-28 LAB — URINALYSIS, COMPLETE (UACMP) WITH MICROSCOPIC
Bilirubin Urine: NEGATIVE
Glucose, UA: NEGATIVE mg/dL
Ketones, ur: NEGATIVE mg/dL
Leukocytes,Ua: NEGATIVE
Nitrite: NEGATIVE
Protein, ur: 30 mg/dL — AB
Specific Gravity, Urine: 1.015 (ref 1.005–1.030)
Squamous Epithelial / HPF: NONE SEEN (ref 0–5)
pH: 6 (ref 5.0–8.0)

## 2020-02-28 NOTE — Telephone Encounter (Signed)
Called pt he states that he is having intermittent lower abdominal pain x 3 days. Pt states that he is also having dysuria that is worsening. Pt denies nausea, vomiting, or fever. Pt describes abdominal pain as "throbbing". Patient has had daily bowel movement with no issues. Pt states that he does find relief with of pain with Toradol but is concerned about infection and trying to make it through the holidays with possible infection and is low on pain medication. Pt scheduled for [place] clinic today and advised to go to lab prior for UA & KUB. Orders placed.

## 2020-02-28 NOTE — Addendum Note (Signed)
Addended by: Tommy Rainwater on: 02/28/2020 11:39 AM   Modules accepted: Orders

## 2020-02-28 NOTE — Progress Notes (Signed)
02/28/2020 2:30 PM   Joseph Dalton [birth year] [id number]  Referring provider: [name], MD [name],  [name] [address]  Chief Complaint  Patient presents with  . Post-op Follow-up    post litho w/KUB    HPI: Joseph Dalton is a 59 y.o. who is status post ESWL who presents today for follow up.  Underwent ESWL on 02/17/2020 for a 5 mm right proximal ureteral stone with Dr. Diamantina Providence. Their postprocedural course was as expected and uneventful.   They have passed fragments, but he did not bring them to today's visit.    He is having suprapubic pain off and on that started on Friday.  It is controlled with Toradol and a heating pad.  He is also having slight dysuria, urgency and frequency.  Patient denies any modifying or aggravating factors.  Patient denies any gross hematuria or flank pain.  Patient denies any fevers, chills, nausea or vomiting.   KUB 02/28/2020 notes some migrated fragment in the right UVJ.    UA 6-10 WBC's and few bacteria.    PMH: Past Medical History:  Diagnosis Date  . HTN (hypertension)   . Hyperlipemia   . Venous insufficiency     Surgical History: Past Surgical History:  Procedure Laterality Date  . COLONOSCOPY WITH PROPOFOL N/A 06/15/2018   Procedure: COLONOSCOPY WITH PROPOFOL;  Surgeon: Manya Silvas, MD;  Location: [facility] ENDOSCOPY;  Service: Endoscopy;  Laterality: N/A;  . EXTRACORPOREAL SHOCK WAVE LITHOTRIPSY Right 02/17/2020   Procedure: EXTRACORPOREAL SHOCK WAVE LITHOTRIPSY (ESWL);  Surgeon: Billey Co, MD;  Location: [facility] ORS;  Service: Urology;  Laterality: Right;  . NO PAST SURGERIES      Home Medications:  Current Outpatient Medications on File Prior to Visit  Medication Sig Dispense Refill  . amLODipine (NORVASC) 10 MG tablet TAKE (1) TABLET BY MOUTH DAILY AT BEDTIME 90 tablet 3  . atorvastatin (LIPITOR) 20 MG tablet TAKE ONE TABLET BY MOUTH DAILY AT BEDTIME. 90 tablet 3  . etodolac (LODINE) 500 MG  tablet Take 500 mg by mouth 2 (two) times daily.    Marland Kitchen ketorolac (TORADOL) 10 MG tablet Take 10 mg by mouth every 6 (six) hours as needed.    . Multiple Vitamins-Minerals (MULTIVITAMIN WITH MINERALS) tablet Take 1 tablet by mouth daily.    . tamsulosin (FLOMAX) 0.4 MG CAPS capsule Take 1 capsule (0.4 mg total) by mouth daily. 30 capsule 0  . triamterene-hydrochlorothiazide (MAXZIDE-25) 37.5-25 MG tablet TAKE ONE (1) TABLET BY MOUTH ONCE DAILY 90 tablet 3   No current facility-administered medications on file prior to visit.    Allergies: No Known Allergies  Family History: Family History  Problem Relation Age of Onset  . Stroke Mother   . Hyperlipidemia Mother   . Heart disease Father 59       heart attack  . Breast cancer Neg Hx     Social History:  reports that he has never smoked. His smokeless tobacco use includes chew. He reports current alcohol use of about 1.0 standard drink of alcohol per week. He reports that he does not use drugs.  ROS: Pertinent ROS in HPI  Physical Exam: BP (!) 149/106   Pulse 80   Ht 6\' 2"  (1.88 m)   Wt 255 lb (115.7 kg)   BMI 32.74 kg/m   Constitutional:  Well nourished. Alert and oriented, No acute distress. HEENT: [place] AT, mask in place.   Trachea midline. Cardiovascular: No clubbing, cyanosis, or  edema. Respiratory: Normal respiratory effort, no increased work of breathing. Neurologic: Grossly intact, no focal deficits, moving all 4 extremities. Psychiatric: Normal mood and affect.  Laboratory Data: Lab Results  Component Value Date   WBC 11.5 (H) 02/04/2020   HGB 16.7 02/04/2020   HCT 49.4 02/04/2020   MCV 87.1 02/04/2020   PLT 217 02/04/2020    Lab Results  Component Value Date   CREATININE 1.43 (H) 02/04/2020       Component Value Date/Time   CHOL 173 01/05/2020 0854   HDL 43.90 01/05/2020 0854   CHOLHDL 4 01/05/2020 0854   VLDL 20.8 01/05/2020 0854   LDLCALC 108 (H) 01/05/2020 0854    Urinalysis Component     Latest  Ref Rng & Units 02/28/2020  Color, Urine     YELLOW YELLOW  Appearance     CLEAR CLEAR  Specific Gravity, Urine     1.005 - 1.030 1.015  pH     5.0 - 8.0 6.0  Glucose, UA     NEGATIVE mg/dL NEGATIVE  Hgb urine dipstick     NEGATIVE TRACE (A)  Bilirubin Urine     NEGATIVE NEGATIVE  Ketones, ur     NEGATIVE mg/dL NEGATIVE  Protein     NEGATIVE mg/dL 30 (A)  Nitrite     NEGATIVE NEGATIVE  Leukocytes,Ua     NEGATIVE NEGATIVE  Squamous Epithelial / LPF     0 - 5 NONE SEEN  WBC, UA     0 - 5 WBC/hpf 6-10  RBC / HPF     0 - 5 RBC/hpf 0-5  Bacteria, UA     NONE SEEN FEW (A)   I have reviewed the labs.   Pertinent Imaging: CLINICAL DATA:  Right ureteral stone  EXAM: ABDOMEN - 1 VIEW  COMPARISON:  02/17/2020  FINDINGS: Left lower pole nephrolithiasis again noted. Calcification is now seen in the right lower pelvis, likely migration of the previously seen right ureteral stone. Other calcifications in the pelvis are stable and compatible with phleboliths/vascular. Nonobstructive bowel gas pattern. No organomegaly.  IMPRESSION: Previously seen right ureteral stone in the region of the proximal ureter now appears to have migrated and is likely at or near the right UVJ.  Left nephrolithiasis.   Electronically Signed   By: Rolm Baptise M.D.   On: 02/29/2020 08:44  I have independently reviewed the films.  See HPI.   Assessment & Plan:    1. Right ureteral stone - reminded patient to bring fragments to next appointment so that we can send them for analysis  - a stone fragment is located at the right UVJ - explained to Joseph Dalton that the symptoms that he is experiencing are common with a UVJ stone - he will increase his tamsulosin to twice daily, increase his water intake and take the Toradol for pain - he will return on the 1st with Sam for follow up - if the pain becomes unbearable, he will call the office for a sooner appointment  Return for keep  follow up on the 1st .  These notes generated with voice recognition software. I apologize for typographical errors.  [name], PA-C  [facility] [address]  [place], [place] [address] [phone]

## 2020-02-28 NOTE — Telephone Encounter (Signed)
Wife reports that patient is having severe lower abdominal pain only during late evening into the night. He has had to take pain medication for the pain. He had ESWL on 02/17/2020. Please advise.

## 2020-02-29 LAB — URINE CULTURE: Culture: NO GROWTH

## 2020-03-01 ENCOUNTER — Telehealth: Payer: Self-pay | Admitting: Family Medicine

## 2020-03-01 NOTE — Telephone Encounter (Signed)
Patient notified and voiced understanding.

## 2020-03-01 NOTE — Telephone Encounter (Signed)
-----   Message from Nori Riis, PA-C sent at 03/01/2020  7:59 AM EST ----- Please let Mr. Carmen know that his urine culture is negative for infection.

## 2020-03-08 ENCOUNTER — Ambulatory Visit
Admission: RE | Admit: 2020-03-08 | Discharge: 2020-03-08 | Disposition: A | Discharge status: Home / Self Care | Payer: BC Managed Care – PPO | Attending: Urology | Admitting: Urology

## 2020-03-08 ENCOUNTER — Ambulatory Visit
Admission: RE | Admit: 2020-03-08 | Discharge: 2020-03-08 | Disposition: A | Discharge status: Home / Self Care | Payer: BC Managed Care – PPO | Source: Ambulatory Visit | Attending: Urology | Admitting: Urology

## 2020-03-08 ENCOUNTER — Encounter: Payer: Self-pay | Admitting: Physician Assistant

## 2020-03-08 ENCOUNTER — Ambulatory Visit (INDEPENDENT_AMBULATORY_CARE_PROVIDER_SITE_OTHER): Payer: BC Managed Care – PPO | Admitting: Physician Assistant

## 2020-03-08 ENCOUNTER — Other Ambulatory Visit: Payer: Self-pay

## 2020-03-08 VITALS — BP 144/93 | HR 70 | Ht 74.0 in | Wt 272.0 lb

## 2020-03-08 DIAGNOSIS — R1030 Lower abdominal pain, unspecified: Secondary | ICD-10-CM

## 2020-03-08 DIAGNOSIS — N200 Calculus of kidney: Secondary | ICD-10-CM | POA: Diagnosis not present

## 2020-03-08 NOTE — Progress Notes (Signed)
03/08/2020 1:53 PM   Joseph Dalton 11-16-1960 [id number]  CC: Chief Complaint  Patient presents with  . Nephrolithiasis    HPI: Joseph Dalton is a 59 y.o. male with PMH nephrolithiasis s/p ESWL on 02/17/2020 with Dr. Diamantina Providence for management of a 5 mm proximal right ureteral stone.  He was seen in follow-up by Joseph Dalton on 02/28/2020.  At that time, he was noted to have a residual fragment at the right UVJ.  Patient reports having subsequently passed several fragments.  He brings them with him to clinic today for analysis.  He reports resolution of pain and gross hematuria.  No acute concerns today.  In-office UA and microscopy today pan negative.  PMH: Past Medical History:  Diagnosis Date  . HTN (hypertension)   . Hyperlipemia   . Venous insufficiency     Surgical History: Past Surgical History:  Procedure Laterality Date  . COLONOSCOPY WITH PROPOFOL N/A 06/15/2018   Procedure: COLONOSCOPY WITH PROPOFOL;  Surgeon: Manya Silvas, MD;  Location: [facility] ENDOSCOPY;  Service: Endoscopy;  Laterality: N/A;  . EXTRACORPOREAL SHOCK WAVE LITHOTRIPSY Right 02/17/2020   Procedure: EXTRACORPOREAL SHOCK WAVE LITHOTRIPSY (ESWL);  Surgeon: Billey Co, MD;  Location: [facility] ORS;  Service: Urology;  Laterality: Right;  . NO PAST SURGERIES      Home Medications:  Allergies as of 03/08/2020   No Known Allergies     Medication List       Accurate as of March 08, 2020  1:53 PM. If you have any questions, ask your nurse or doctor.        STOP taking these medications   tamsulosin 0.4 MG Caps capsule Commonly known as: FLOMAX Stopped by: Joseph Loop, PA-C     TAKE these medications   amLODipine 10 MG tablet Commonly known as: NORVASC TAKE (1) TABLET BY MOUTH DAILY AT BEDTIME   atorvastatin 20 MG tablet Commonly known as: LIPITOR TAKE ONE TABLET BY MOUTH DAILY AT BEDTIME.   etodolac 500 MG tablet Commonly known as: LODINE Take 500 mg by mouth 2 (two)  times daily.   ketorolac 10 MG tablet Commonly known as: TORADOL Take 10 mg by mouth every 6 (six) hours as needed.   multivitamin with minerals tablet Take 1 tablet by mouth daily.   triamterene-hydrochlorothiazide 37.5-25 MG tablet Commonly known as: MAXZIDE-25 TAKE ONE (1) TABLET BY MOUTH ONCE DAILY       Allergies:  No Known Allergies  Family History: Family History  Problem Relation Age of Onset  . Stroke Mother   . Hyperlipidemia Mother   . Heart disease Father 11       heart attack  . Breast cancer Neg Hx     Social History:   reports that he has never smoked. His smokeless tobacco use includes chew. He reports current alcohol use of about 1.0 standard drink of alcohol per week. He reports that he does not use drugs.  Physical Exam: BP (!) 144/93 (BP Location: Left Arm, Patient Position: Sitting, Cuff Size: Large)   Pulse 70   Ht 6\' 2"  (1.88 m)   Wt 272 lb (123.4 kg)   BMI 34.92 kg/m   Constitutional:  Alert and oriented, no acute distress, nontoxic appearing HEENT: [place], AT Cardiovascular: No clubbing, cyanosis, or edema Respiratory: Normal respiratory effort, no increased work of breathing Skin: No rashes, bruises or suspicious lesions Neurologic: Grossly intact, no focal deficits, moving all 4 extremities Psychiatric: Normal mood and affect  Laboratory Data: Results  for orders placed or performed in visit on 03/08/20  Microscopic Examination   Urine  Result Value Ref Range   WBC, UA 0-5 0 - 5 /hpf   RBC 0-2 0 - 2 /hpf   Epithelial Cells (non renal) 0-10 0 - 10 /hpf   Casts Present (A) None seen /lpf   Cast Type Hyaline casts N/A   Bacteria, UA None seen None seen/Few  Urinalysis, Complete  Result Value Ref Range   Specific Gravity, UA 1.020 1.005 - 1.030   pH, UA 6.0 5.0 - 7.5   Color, UA Yellow Yellow   Appearance Ur Clear Clear   Leukocytes,UA Negative Negative   Protein,UA Negative Negative/Trace   Glucose, UA Negative Negative   Ketones,  UA Negative Negative   RBC, UA Negative Negative   Bilirubin, UA Negative Negative   Urobilinogen, Ur 0.2 0.2 - 1.0 mg/dL   Nitrite, UA Negative Negative   Microscopic Examination See below:    Pertinent Imaging: KUB, 03/08/2020: CLINICAL DATA:  Patient status post lithotripsy 2 weeks ago.  EXAM: ABDOMEN - 1 VIEW  COMPARISON:  KUB 02/28/2020 and 02/17/2020.  FINDINGS: Two left renal stones are unchanged. Previously seen small calcification in the right pelvis at the expected location of the UVJ is no longer identified. No right urinary tract stones are seen. Bowel gas pattern is nonobstructive.  IMPRESSION: Previously seen calcification in the right pelvis at the expected location of the UVJ is no longer present.  No change in 2 left renal stones.   Electronically Signed   By: Inge Rise M.D.   On: 03/08/2020 15:10  I personally reviewed the images referenced above and note interval clearance of the residual right UVJ fragment.  Assessment & Plan:   1. Kidney stones Pain resolved.  UA benign today.  KUB with interval clearance of the right UVJ fragment.  No further intervention indicated.  Counseled patient that I would contact him with stone prevention recommendations based on his stone analysis results.  We will send fragments for analysis today.  Offered patient annual stone follow-up with KUB versus follow-up as needed.  Patient would like to consider this and will contact her office to schedule per his preference.  I am in agreement with this plan. - Urinalysis, Complete - Calculi, with Photograph  Return if symptoms worsen or fail to improve.  Joseph Loop, PA-C  [facility] [address] [place], [place] [address] [phone]

## 2020-03-08 NOTE — Patient Instructions (Addendum)
Stop Flomax (tamsulosin).  I will send you stone prevention recommendations via MyChart when I receive the results of your stone analysis.  Please contact our office to schedule a stone follow-up visit in 1 year if you would like; otherwise we will see you as-needed.

## 2020-03-10 LAB — MICROSCOPIC EXAMINATION: Bacteria, UA: NONE SEEN

## 2020-03-10 LAB — URINALYSIS, COMPLETE
Bilirubin, UA: NEGATIVE
Glucose, UA: NEGATIVE
Ketones, UA: NEGATIVE
Leukocytes,UA: NEGATIVE
Nitrite, UA: NEGATIVE
Protein,UA: NEGATIVE
RBC, UA: NEGATIVE
Specific Gravity, UA: 1.02 (ref 1.005–1.030)
Urobilinogen, Ur: 0.2 mg/dL (ref 0.2–1.0)
pH, UA: 6 (ref 5.0–7.5)

## 2020-03-13 LAB — CALCULI, WITH PHOTOGRAPH (CLINICAL LAB)
Calcium Oxalate Dihydrate: 30 %
Calcium Oxalate Monohydrate: 65 %
Hydroxyapatite: 5 %
Weight Calculi: 2 mg

## 2020-03-15 ENCOUNTER — Other Ambulatory Visit: Payer: Self-pay | Admitting: Urology

## 2020-05-09 NOTE — Telephone Encounter (Signed)
Called to speak with Joseph Dalton and tried to offer a sooner appointment with another provider. Joseph Dalton declined and stated that he could wait until Friday of next week to talk to Dr. Derrel Nip.

## 2020-05-19 ENCOUNTER — Encounter: Payer: Self-pay | Admitting: Internal Medicine

## 2020-05-19 ENCOUNTER — Telehealth (INDEPENDENT_AMBULATORY_CARE_PROVIDER_SITE_OTHER): Payer: BC Managed Care – PPO | Admitting: Internal Medicine

## 2020-05-19 VITALS — BP 119/80 | Ht 74.02 in

## 2020-05-19 DIAGNOSIS — R519 Headache, unspecified: Secondary | ICD-10-CM

## 2020-05-19 DIAGNOSIS — E221 Hyperprolactinemia: Secondary | ICD-10-CM | POA: Diagnosis not present

## 2020-05-19 MED ORDER — CYCLOBENZAPRINE HCL 10 MG PO TABS: 10.0000 mg | ORAL_TABLET | Freq: Three times a day (TID) | ORAL | 0 refills | Status: AC | PRN

## 2020-05-19 NOTE — Progress Notes (Signed)
Virtual Visit via [place]  This visit type was conducted due to national recommendations for restrictions regarding the COVID-19 pandemic (e.g. social distancing).  This format is felt to be most appropriate for this patient at this time.  All issues noted in this document were discussed and addressed.  No physical exam was performed (except for noted visual exam findings with Video Visits).   I connected with@ on 05/19/20 at  4:30 PM EST by a video enabled telemedicine application  and verified that I am speaking with the correct person using two identifiers. Location patient: home Location provider: work or home office Persons participating in the virtual visit: patient, provider  I discussed the limitations, risks, security and privacy concerns of performing an evaluation and management service by telephone and the availability of in person appointments. I also discussed with the patient that there may be a patient responsible charge related to this service. The patient expressed understanding and agreed to proceed.   Reason for visit: headache  HPI:  60 yr old male with recently diagnosed hyperprolactinemia,  Negative MRI for visible  prolactinoma presents with right sided headache that has been recurring intermittently  For the past 3 weeks. The pain is not accompanied by scalp or temple tenderness,  Conjunctival  Tearing, Vision changes,  Fevers,  Or blurred vision .  The pain is located on the right occipital region and extends to the area above the right ear. It has not occurred daily,  He has not woken up with it.  It is transiently relieved with motrin or tylenol. The quality of the pain is throbbing.   History of gynecomastia and elevated prolactin,  MRI done,  No obvious prolactinoma ,  Referred to endocrinology  In Nov.  Not seen yet   ROS: See pertinent positives and negatives per HPI.  Past Medical History:  Diagnosis Date  . HTN (hypertension)   . Hyperlipemia   . Venous  insufficiency     Past Surgical History:  Procedure Laterality Date  . COLONOSCOPY WITH PROPOFOL N/A 06/15/2018   Procedure: COLONOSCOPY WITH PROPOFOL;  Surgeon: Manya Silvas, MD;  Location: [facility] ENDOSCOPY;  Service: Endoscopy;  Laterality: N/A;  . EXTRACORPOREAL SHOCK WAVE LITHOTRIPSY Right 02/17/2020   Procedure: EXTRACORPOREAL SHOCK WAVE LITHOTRIPSY (ESWL);  Surgeon: Billey Co, MD;  Location: [facility] ORS;  Service: Urology;  Laterality: Right;  . NO PAST SURGERIES      Family History  Problem Relation Age of Onset  . Stroke Mother   . Hyperlipidemia Mother   . Heart disease Father 17       heart attack  . Breast cancer Neg Hx     SOCIAL HX:  reports that he has never smoked. His smokeless tobacco use includes chew. He reports current alcohol use of about 1.0 standard drink of alcohol per week. He reports that he does not use drugs.   Current Outpatient Medications:  .  amLODipine (NORVASC) 10 MG tablet, TAKE (1) TABLET BY MOUTH DAILY AT BEDTIME, Disp: 90 tablet, Rfl: 3 .  atorvastatin (LIPITOR) 20 MG tablet, TAKE ONE TABLET BY MOUTH DAILY AT BEDTIME., Disp: 90 tablet, Rfl: 3 .  cyclobenzaprine (FLEXERIL) 10 MG tablet, Take 1 tablet (10 mg total) by mouth 3 (three) times daily as needed for muscle spasms., Disp: 30 tablet, Rfl: 0 .  Multiple Vitamins-Minerals (MULTIVITAMIN WITH MINERALS) tablet, Take 1 tablet by mouth daily., Disp: , Rfl:  .  triamterene-hydrochlorothiazide (MAXZIDE-25) 37.5-25 MG tablet, TAKE ONE (1) TABLET BY  MOUTH ONCE DAILY, Disp: 90 tablet, Rfl: 3  EXAM:  VITALS per patient if applicable:  GENERAL: alert, oriented, appears well and in no acute distress  HEENT: atraumatic, conjunttiva clear, no obvious abnormalities on inspection of external nose and ears  NECK: normal movements of the head and neck  LUNGS: on inspection no signs of respiratory distress, breathing rate appears normal, no obvious gross SOB, gasping or wheezing  CV: no obvious  cyanosis  MS: moves all visible extremities without noticeable abnormality  PSYCH/NEURO: pleasant and cooperative, no obvious depression or anxiety, speech and thought processing grossly intact  ASSESSMENT AND PLAN:  Discussed the following assessment and plan:  Right-sided headache - Plan: DG Cervical Spine Complete  Acute nonintractable headache, unspecified headache type  Hyperprolactinemia (HCC)  Headache, acute I Suspect that is headaches may be due to OA of the cervical spine .  plain films ordered. Recommending use of aleve and tylenol prn,  Avoid use of NSAID daily .  Adding muscle relaxer   Hyperprolactinemia ([place]) With gynecomastia on mammogram.  MRI negative for obvious prolactinoma.  Referring to Endocrinology     I discussed the assessment and treatment plan with the patient. The patient was provided an opportunity to ask questions and all were answered. The patient agreed with the plan and demonstrated an understanding of the instructions.   The patient was advised to call back or seek an in-person evaluation if the symptoms worsen or if the condition fails to improve as anticipated.   I spent 30 minutes dedicated to the care of this patient on the date of this encounter to include pre-visit review of his medical history,  Face-to-face time with the patient , and post visit ordering of testing and therapeutics.    [name], MD

## 2020-05-19 NOTE — Patient Instructions (Signed)
I think your headache may be coming from arthritis in your neck /spine    I have ordered an x ray over at [facility] .  You do not need an appointment for this  For your pain,  You can take 600 mg motrin up to 3 times daily  AND TYLENOL 1000 mg twice daily   I will call in a muscle relaxer to see if it helps .  This can be added to the other 2 medications if your scalp or neck muscles feel tight

## 2020-05-21 DIAGNOSIS — E221 Hyperprolactinemia: Secondary | ICD-10-CM | POA: Insufficient documentation

## 2020-05-21 DIAGNOSIS — R519 Headache, unspecified: Secondary | ICD-10-CM | POA: Insufficient documentation

## 2020-05-21 NOTE — Assessment & Plan Note (Signed)
With gynecomastia on mammogram.  MRI negative for obvious prolactinoma.  Referring to Endocrinology

## 2020-05-21 NOTE — Assessment & Plan Note (Signed)
I Suspect that is headaches may be due to OA of the cervical spine .  plain films ordered. Recommending use of aleve and tylenol prn,  Avoid use of NSAID daily .  Adding muscle relaxer

## 2020-06-01 ENCOUNTER — Ambulatory Visit
Admission: RE | Admit: 2020-06-01 | Discharge: 2020-06-01 | Disposition: A | Discharge status: Home / Self Care | Payer: BC Managed Care – PPO | Attending: Internal Medicine | Admitting: Internal Medicine

## 2020-06-01 ENCOUNTER — Ambulatory Visit
Admission: RE | Admit: 2020-06-01 | Discharge: 2020-06-01 | Disposition: A | Discharge status: Home / Self Care | Payer: BC Managed Care – PPO | Source: Ambulatory Visit | Attending: Internal Medicine | Admitting: Internal Medicine

## 2020-06-01 DIAGNOSIS — R519 Headache, unspecified: Secondary | ICD-10-CM | POA: Insufficient documentation

## 2020-07-05 ENCOUNTER — Ambulatory Visit: Payer: BC Managed Care – PPO | Admitting: Internal Medicine

## 2020-08-02 ENCOUNTER — Ambulatory Visit: Payer: BC Managed Care – PPO | Admitting: Internal Medicine

## 2020-09-07 ENCOUNTER — Encounter: Payer: Self-pay | Admitting: Internal Medicine

## 2020-09-07 ENCOUNTER — Other Ambulatory Visit: Payer: Self-pay

## 2020-09-07 ENCOUNTER — Ambulatory Visit: Payer: BC Managed Care – PPO | Admitting: Internal Medicine

## 2020-09-07 VITALS — BP 116/82 | HR 61 | Temp 98.3°F | Ht 74.02 in | Wt 283.6 lb

## 2020-09-07 DIAGNOSIS — R7301 Impaired fasting glucose: Secondary | ICD-10-CM

## 2020-09-07 DIAGNOSIS — E221 Hyperprolactinemia: Secondary | ICD-10-CM

## 2020-09-07 DIAGNOSIS — E782 Mixed hyperlipidemia: Secondary | ICD-10-CM

## 2020-09-07 DIAGNOSIS — I10 Essential (primary) hypertension: Secondary | ICD-10-CM

## 2020-09-07 DIAGNOSIS — E669 Obesity, unspecified: Secondary | ICD-10-CM

## 2020-09-07 LAB — HEMOGLOBIN A1C: Hgb A1c MFr Bld: 5.4 % (ref 4.6–6.5)

## 2020-09-07 MED ORDER — DOXYCYCLINE HYCLATE 100 MG PO TABS: 100.0000 mg | ORAL_TABLET | Freq: Two times a day (BID) | ORAL | 0 refills | Status: DC

## 2020-09-07 NOTE — Progress Notes (Signed)
Subjective:  Patient ID: Joseph Dalton, male    DOB: 11-28-1960  Age: 60 y.o. MRN: [id number]  CC: The primary encounter diagnosis was Mixed hyperlipidemia. Diagnoses of Impaired fasting glucose, Hyperprolactinemia (HCC), White coat syndrome with diagnosis of hypertension, and Obesity (BMI 30-39.9) were also pertinent to this visit.  HPI Joseph Dalton presents for FOLLOW UP ON CHRONIC ISSUES  This visit occurred during the SARS-CoV-2 public health emergency.  Safety protocols were in place, including screening questions prior to the visit, additional usage of staff PPE, and extensive cleaning of exam room while observing appropriate contact time as indicated for disinfecting solutions.   1) Gynecomastia with hyperprolactinemia:  We reviewed his ongoing endocrine workup for hyperprolactinemia.  He has been Taking carbergoline since early April.  Reviewed alcohol consumption . He drinks at the most  2 beers on any given night,  Usually less.  His breasts are no longer tender  2) Obesity:  Weight gain addressed.  Diet and exercise reviewed in detail.  He is not exercising because he is physically active on his job. I explained the difference between physical labor and aerobic exercise and weight training and the benefits to regular exercise .   Outpatient Medications Prior to Visit  Medication Sig Dispense Refill  . amLODipine (NORVASC) 10 MG tablet TAKE (1) TABLET BY MOUTH DAILY AT BEDTIME 90 tablet 3  . atorvastatin (LIPITOR) 20 MG tablet TAKE ONE TABLET BY MOUTH DAILY AT BEDTIME. 90 tablet 3  . cabergoline (DOSTINEX) 0.5 MG tablet Take 0.5 tablets by mouth once a week.    . cyclobenzaprine (FLEXERIL) 10 MG tablet Take 1 tablet (10 mg total) by mouth 3 (three) times daily as needed for muscle spasms. 30 tablet 0  . Multiple Vitamins-Minerals (MULTIVITAMIN WITH MINERALS) tablet Take 1 tablet by mouth daily.    Marland Kitchen triamterene-hydrochlorothiazide (MAXZIDE-25) 37.5-25 MG tablet TAKE ONE (1) TABLET  BY MOUTH ONCE DAILY 90 tablet 3   No facility-administered medications prior to visit.    Review of Systems;  Patient denies headache, fevers, malaise, unintentional weight loss, skin rash, eye pain, sinus congestion and sinus pain, sore throat, dysphagia,  hemoptysis , cough, dyspnea, wheezing, chest pain, palpitations, orthopnea, edema, abdominal pain, nausea, melena, diarrhea, constipation, flank pain, dysuria, hematuria, urinary  Frequency, nocturia, numbness, tingling, seizures,  Focal weakness, Loss of consciousness,  Tremor, insomnia, depression, anxiety, and suicidal ideation.      Objective:  BP 116/82 (BP Location: Left Arm, Patient Position: Sitting, Cuff Size: Large)   Pulse 61   Temp 98.3 F (36.8 C) (Oral)   Ht 6' 2.02" (1.88 m)   Wt 283 lb 9.6 oz (128.6 kg)   SpO2 97%   BMI 36.39 kg/m   BP Readings from Last 3 Encounters:  09/07/20 116/82  05/19/20 119/80  03/08/20 (!) 144/93    Wt Readings from Last 3 Encounters:  09/07/20 283 lb 9.6 oz (128.6 kg)  03/08/20 272 lb (123.4 kg)  02/28/20 255 lb (115.7 kg)    General appearance: alert, cooperative and appears stated age Ears: normal TM's and external ear canals both ears Throat: lips, mucosa, and tongue normal; teeth and gums normal Neck: no adenopathy, no carotid bruit, supple, symmetrical, trachea midline and thyroid not enlarged, symmetric, no tenderness/mass/nodules Back: symmetric, no curvature. ROM normal. No CVA tenderness. Lungs: clear to auscultation bilaterally Heart: regular rate and rhythm, S1, S2 normal, no murmur, click, rub or gallop Abdomen: soft, non-tender; bowel sounds normal; no masses,  no organomegaly  Pulses: 2+ and symmetric Skin: Skin color, texture, turgor normal. No rashes or lesions Lymph nodes: Cervical, supraclavicular, and axillary nodes normal.  Lab Results  Component Value Date   HGBA1C 5.4 09/07/2020   HGBA1C 5.4 01/05/2020   HGBA1C 5.3 12/31/2018    Lab Results   Component Value Date   CREATININE 1.01 09/07/2020   CREATININE 1.43 (H) 02/04/2020   CREATININE 0.98 01/05/2020    Lab Results  Component Value Date   WBC 11.5 (H) 02/04/2020   HGB 16.7 02/04/2020   HCT 49.4 02/04/2020   PLT 217 02/04/2020   GLUCOSE 99 09/07/2020   CHOL 156 09/07/2020   TRIG 100.0 09/07/2020   HDL 40.10 09/07/2020   LDLDIRECT 98.0 06/03/2016   LDLCALC 96 09/07/2020   ALT 29 09/07/2020   AST 22 09/07/2020   NA 142 09/07/2020   K 3.6 09/07/2020   CL 103 09/07/2020   CREATININE 1.01 09/07/2020   BUN 25 (H) 09/07/2020   CO2 27 09/07/2020   TSH 1.18 12/31/2018   PSA 1.97 01/05/2020   HGBA1C 5.4 09/07/2020   MICROALBUR <0.7 06/11/2017    DG Cervical Spine Complete  Result Date: 06/02/2020 CLINICAL DATA:  Recurrent RIGHT-sided headaches EXAM: CERVICAL SPINE - COMPLETE 4+ VIEW COMPARISON:  None FINDINGS: Low normal osseous mineralization. Disc space narrowing and endplate spur formation C5-C6 and C6-C7. Vertebral body heights maintained without fracture or subluxation. Small uncovertebral spurs encroach upon cervical neural foramina bilaterally at C6-C7 and at RIGHT C4-C5. Prevertebral soft tissues normal thickness. Lung apices clear. 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 Electronically Signed   By: Lavonia Dana M.D.   On: 06/02/2020 13:34    Assessment & Plan:   Problem List Items Addressed This Visit      Unprioritized   Hyperlipidemia - Primary    Untreated 10 yr risk of events is 15% .  Continue atorvastatin 20 mg daily. Marland Kitchen He has  no side effects and liver enzymes are due.   No changes today   Lab Results  Component Value Date   CHOL 156 09/07/2020   HDL 40.10 09/07/2020   LDLCALC 96 09/07/2020   LDLDIRECT 98.0 06/03/2016   TRIG 100.0 09/07/2020   CHOLHDL 4 09/07/2020   Lab Results  Component Value Date   ALT 29 09/07/2020   AST 22 09/07/2020   ALKPHOS 92 09/07/2020   BILITOT 0.6 09/07/2020          Relevant Orders   Lipid panel (Completed)   Hyperprolactinemia ([place])    Found during workup for gynecomastia. MRI was negative for macroadenoma.  Elevated level is persistent by repeat testing during Endocrinology .  Now taking cabergoline.  Stressed the importance of returning to Endocrine for follow up      Impaired fasting glucose    He has had normal A1cs  Despite gaining weight. .   Lab Results  Component Value Date   HGBA1C 5.4 09/07/2020         Relevant Orders   Comprehensive metabolic panel (Completed)   Hemoglobin A1c (Completed)   Obesity (BMI 30-39.9)    Weight gain addressed.  Diet and exercise reviewed in detail.  He is not exercising because he is physically active on his job. I explained the difference between physical labor and aerobic exercise and weight training and the benefits to regular exercise .       White coat syndrome with diagnosis of hypertension    Well controlled on current  regimen of maxzide and amlodipine . Marland Kitchen Renal function stable, no changes today.  Lab Results  Component Value Date   CREATININE 1.01 09/07/2020   Lab Results  Component Value Date   NA 142 09/07/2020   K 3.6 09/07/2020   CL 103 09/07/2020   CO2 27 09/07/2020           I provided  30 minutes of  face-to-face time during this encounter reviewing patient's recent workup for gynecomastia and hyperprolactinemia, current weight , labs and imaging studies, providing counseling on the above mentioned problems , and coordination  of care .  I am having Rohan L. Thurman Coyer "Mikki Santee" start on doxycycline. I am also having him maintain his multivitamin with minerals, amLODipine, atorvastatin, triamterene-hydrochlorothiazide, cyclobenzaprine, and cabergoline.  Meds ordered this encounter  Medications  . doxycycline (VIBRA-TABS) 100 MG tablet    Sig: Take 1 tablet (100 mg total) by mouth 2 (two) times daily.    Dispense:  14 tablet    Refill:  0    There are no discontinued  medications.  Follow-up: Return in about 6 months (around 03/09/2021).   [name], MD

## 2020-09-07 NOTE — Patient Instructions (Addendum)
I want you to lose 30 lbs over the next 6 months. I don't care how you do it as long as it is healthy;  Portion reduction allows you to  Lose weight without "missing out"  [place]

## 2020-09-08 LAB — COMPREHENSIVE METABOLIC PANEL
ALT: 29 U/L (ref 0–53)
AST: 22 U/L (ref 0–37)
Albumin: 4.2 g/dL (ref 3.5–5.2)
Alkaline Phosphatase: 92 U/L (ref 39–117)
BUN: 25 mg/dL — ABNORMAL HIGH (ref 6–23)
CO2: 27 mEq/L (ref 19–32)
Calcium: 9 mg/dL (ref 8.4–10.5)
Chloride: 103 mEq/L (ref 96–112)
Creatinine, Ser: 1.01 mg/dL (ref 0.40–1.50)
GFR: 81.94 mL/min (ref >60.00)
Glucose, Bld: 99 mg/dL (ref 70–99)
Potassium: 3.6 mEq/L (ref 3.5–5.1)
Sodium: 142 mEq/L (ref 135–145)
Total Bilirubin: 0.6 mg/dL (ref 0.2–1.2)
Total Protein: 6.5 g/dL (ref 6.0–8.3)

## 2020-09-08 LAB — LIPID PANEL
Cholesterol: 156 mg/dL (ref 0–200)
HDL: 40.1 mg/dL (ref >39.00)
LDL Cholesterol: 96 mg/dL (ref 0–99)
NonHDL: 115.61
Total CHOL/HDL Ratio: 4
Triglycerides: 100 mg/dL (ref 0.0–149.0)
VLDL: 20 mg/dL (ref 0.0–40.0)

## 2020-09-09 NOTE — Assessment & Plan Note (Signed)
Untreated 10 yr risk of events is 15% .  Continue atorvastatin 20 mg daily. Marland Kitchen He has  no side effects and liver enzymes are due.   No changes today   Lab Results  Component Value Date   CHOL 156 09/07/2020   HDL 40.10 09/07/2020   LDLCALC 96 09/07/2020   LDLDIRECT 98.0 06/03/2016   TRIG 100.0 09/07/2020   CHOLHDL 4 09/07/2020   Lab Results  Component Value Date   ALT 29 09/07/2020   AST 22 09/07/2020   ALKPHOS 92 09/07/2020   BILITOT 0.6 09/07/2020

## 2020-09-09 NOTE — Assessment & Plan Note (Signed)
He has had normal A1cs  Despite gaining weight. .   Lab Results  Component Value Date   HGBA1C 5.4 09/07/2020

## 2020-09-09 NOTE — Assessment & Plan Note (Signed)
Weight gain addressed.  Diet and exercise reviewed in detail.  He is not exercising because he is physically active on his job. I explained the difference between physical labor and aerobic exercise and weight training and the benefits to regular exercise .

## 2020-09-09 NOTE — Assessment & Plan Note (Signed)
Well controlled on current regimen of maxzide and amlodipine . Marland Kitchen Renal function stable, no changes today.  Lab Results  Component Value Date   CREATININE 1.01 09/07/2020   Lab Results  Component Value Date   NA 142 09/07/2020   K 3.6 09/07/2020   CL 103 09/07/2020   CO2 27 09/07/2020

## 2020-09-09 NOTE — Assessment & Plan Note (Signed)
Found during workup for gynecomastia. MRI was negative for macroadenoma.  Elevated level is persistent by repeat testing during Endocrinology .  Now taking cabergoline.  Stressed the importance of returning to Endocrine for follow up

## 2020-10-03 ENCOUNTER — Other Ambulatory Visit: Payer: Self-pay | Admitting: Internal Medicine

## 2020-10-23 ENCOUNTER — Other Ambulatory Visit: Payer: Self-pay | Admitting: Internal Medicine

## 2020-12-26 ENCOUNTER — Other Ambulatory Visit: Payer: Self-pay

## 2020-12-26 ENCOUNTER — Ambulatory Visit: Payer: BC Managed Care – PPO | Admitting: Cardiovascular Disease

## 2020-12-26 ENCOUNTER — Encounter: Payer: Self-pay | Admitting: Cardiovascular Disease

## 2020-12-26 VITALS — BP 130/82 | HR 74 | Ht 74.0 in | Wt 281.5 lb

## 2020-12-26 DIAGNOSIS — E221 Hyperprolactinemia: Secondary | ICD-10-CM

## 2020-12-26 DIAGNOSIS — E782 Mixed hyperlipidemia: Secondary | ICD-10-CM | POA: Diagnosis not present

## 2020-12-26 DIAGNOSIS — Z8342 Family history of familial hypercholesterolemia: Secondary | ICD-10-CM

## 2020-12-26 NOTE — Progress Notes (Signed)
Cardiology Office Note  Date:  12/26/2020   ID:  ESGAR BARNICK, DOB 1961-03-09, MRN [id number]  PCP:  [name], MD   Chief Complaint  Patient presents with   New Patient (Initial Visit)    Self referral for cardiac evaluation. Medications reviewed by the patient verbally.     HPI:  Joseph Dalton is a 60 year old male with past medical history of Hyperprolactinemia, work-up for gynecomastia Obesity Presenting by referral from Dr. Derrel Nip for consultation of his hyperlipidemia, cardiac risk factors  Reports he has been on cholesterol medication for many years Lipitor for many years Total cholesterol 156  Non-smoker, no diabetes Very active in the daytime, works in Architect, grating, Psychologist, forensic work  Denies any chest pain or shortness of breath concerning for angina  EKG personally reviewed by myself on todays visit Normal sinus rhythm rate 74 bpm no significant ST-T wave changes    PMH:   has a past medical history of HTN (hypertension), Hyperlipemia, and Venous insufficiency.  PSH:    Past Surgical History:  Procedure Laterality Date   COLONOSCOPY WITH PROPOFOL N/A 06/15/2018   Procedure: COLONOSCOPY WITH PROPOFOL;  Surgeon: Manya Silvas, MD;  Location: [facility] ENDOSCOPY;  Service: Endoscopy;  Laterality: N/A;   EXTRACORPOREAL SHOCK WAVE LITHOTRIPSY Right 02/17/2020   Procedure: EXTRACORPOREAL SHOCK WAVE LITHOTRIPSY (ESWL);  Surgeon: Billey Co, MD;  Location: [facility] ORS;  Service: Urology;  Laterality: Right;   NO PAST SURGERIES      Current Outpatient Medications  Medication Sig Dispense Refill   amLODipine (NORVASC) 10 MG tablet TAKE (1) TABLET BY MOUTH DAILY AT BEDTIME 90 tablet 3   atorvastatin (LIPITOR) 20 MG tablet TAKE ONE TABLET BY MOUTH DAILY AT BEDTIME. 90 tablet 3   cabergoline (DOSTINEX) 0.5 MG tablet Take 0.5 tablets by mouth once a week.     cyclobenzaprine (FLEXERIL) 10 MG tablet Take 1 tablet (10 mg total) by mouth 3 (three) times daily as needed  for muscle spasms. 30 tablet 0   Multiple Vitamins-Minerals (MULTIVITAMIN WITH MINERALS) tablet Take 1 tablet by mouth daily.     triamterene-hydrochlorothiazide (MAXZIDE-25) 37.5-25 MG tablet TAKE ONE (1) TABLET BY MOUTH ONCE DAILY 90 tablet 3   doxycycline (VIBRA-TABS) 100 MG tablet Take 1 tablet (100 mg total) by mouth 2 (two) times daily. (Patient not taking: Reported on 12/26/2020) 14 tablet 0   No current facility-administered medications for this visit.     Allergies:   Patient has no known allergies.   Social History:  The patient  reports that he has never smoked. His smokeless tobacco use includes chew. He reports current alcohol use of about 1.0 standard drink per week. He reports that he does not use drugs.   Family History:   family history includes Heart disease (age of onset: 18) in his father; Hyperlipidemia in his mother; Stroke in his mother.    Review of Systems: Review of Systems  Constitutional: Negative.   HENT: Negative.    Respiratory: Negative.    Cardiovascular: Negative.   Gastrointestinal: Negative.   Musculoskeletal: Negative.   Neurological: Negative.   Psychiatric/Behavioral: Negative.    All other systems reviewed and are negative.   PHYSICAL EXAM: VS:  BP 130/82 (BP Location: Right Arm, Patient Position: Sitting, Cuff Size: Large)   Pulse 74   Ht 6\' 2"  (1.88 m)   Wt 281 lb 8 oz (127.7 kg)   SpO2 98%   BMI 36.14 kg/m  , BMI Body mass index is 36.14  kg/m. GEN: Well nourished, well developed, in no acute distress HEENT: normal Neck: no JVD, carotid bruits, or masses Cardiac: RRR; no murmurs, rubs, or gallops,no edema  Respiratory:  clear to auscultation bilaterally, normal work of breathing GI: soft, nontender, nondistended, + BS MS: no deformity or atrophy Skin: warm and dry, no rash Neuro:  Strength and sensation are intact Psych: euthymic mood, full affect  Recent Labs: 02/04/2020: Hemoglobin 16.7; Platelets 217 09/07/2020: ALT 29; BUN  25; Creatinine, Ser 1.01; Potassium 3.6; Sodium 142    Lipid Panel Lab Results  Component Value Date   CHOL 156 09/07/2020   HDL 40.10 09/07/2020   LDLCALC 96 09/07/2020   TRIG 100.0 09/07/2020      Wt Readings from Last 3 Encounters:  12/26/20 281 lb 8 oz (127.7 kg)  09/07/20 283 lb 9.6 oz (128.6 kg)  03/08/20 272 lb (123.4 kg)       ASSESSMENT AND PLAN:  Problem List Items Addressed This Visit       Cardiology Problems   Hyperlipidemia   Relevant Orders   EKG 12-Lead   CT CARDIAC SCORING (SELF PAY ONLY)     Other   Hyperprolactinemia (HCC)   Relevant Orders   CT CARDIAC SCORING (SELF PAY ONLY)   Other Visit Diagnoses     Family history of high cholesterol    -  Primary   Relevant Orders   CT CARDIAC SCORING (SELF PAY ONLY)      Hyperlipidemia Well-controlled on current dose of statin  Cardiac risk factors Non-smoker, no diabetes, cholesterol well controlled Concerned coronary disease given family history We will order CT coronary calcium scoring for risk stratification  Obesity We have encouraged continued exercise, careful diet management in an effort to lose weight.    Total encounter time more than 45 minutes  Greater than 50% was spent in counseling and coordination of care with the patient    Signed, Esmond Plants, M.D., Ph.D. [place], [place]

## 2020-12-26 NOTE — Patient Instructions (Addendum)
Medication Instructions:  No changes  If you need a refill on your cardiac medications before your next appointment, please call your pharmacy.   Lab work: No new labs needed  Testing/Procedures: We have order a CT coronary calcium score (you may see results on your MyChart account, but we will call via phone with the results) This is a $99 out of pocket expense   This procedure uses special x-ray equipment to produce pictures of the coronary arteries to determine if they are blocked or narrowed by the buildup of plaque - an indicator for atherosclerosis or coronary artery disease (CAD).  Please call [phone] to schedule at your earliest convince   This is done at our [facility] in [facility] [place], [place] [address]   Follow-Up: At [facility], you and your health needs are our priority.  As part of our continuing mission to provide you with exceptional heart care, we have created designated Provider Care Teams.  These Care Teams include your primary Cardiologist (physician) and Advanced Practice Providers (APPs -  Physician Assistants and Nurse Practitioners) who all work together to provide you with the care you need, when you need it.  You will need a follow up appointment as needed  Providers on your designated Care Team:   Murray Hodgkins, NP Christell Faith, PA-C Marrianne Mood, PA-C Cadence Portis, [place]  COVID-19 Vaccine Information can be found at: ShippingScam.co.uk For questions related to vaccine distribution or appointments, please email [email or web address] or call [phone].

## 2021-01-01 ENCOUNTER — Other Ambulatory Visit: Payer: Self-pay

## 2021-01-01 ENCOUNTER — Ambulatory Visit
Admission: RE | Admit: 2021-01-01 | Discharge: 2021-01-01 | Disposition: A | Discharge status: Home / Self Care | Payer: BC Managed Care – PPO | Source: Ambulatory Visit | Attending: Cardiovascular Disease | Admitting: Cardiovascular Disease

## 2021-01-01 DIAGNOSIS — Z8342 Family history of familial hypercholesterolemia: Secondary | ICD-10-CM | POA: Insufficient documentation

## 2021-01-01 DIAGNOSIS — E782 Mixed hyperlipidemia: Secondary | ICD-10-CM | POA: Insufficient documentation

## 2021-01-05 ENCOUNTER — Telehealth: Payer: Self-pay | Admitting: Cardiovascular Disease

## 2021-01-05 NOTE — Telephone Encounter (Signed)
Joseph Merritts, MD  01/05/2021  9:48 AM EDT     CT coronary calcium scoring Score 184 Little bit of calcification noted in all vessels but predominantly in the LAD Would consider adding Zetia to his Lipitor to achieve goal LDL less than 70   Small nodule in the lung As he is not a smoker, very low risk of this being something

## 2021-01-05 NOTE — Telephone Encounter (Signed)
Attempted to call the patient. He initially answered the phone and the connection was lost. Attempted to call the patient back. It sounded as if he picked up but I could not hear anyone on the other end.   Will attempt to call the patient back at a later time.

## 2021-01-08 MED ORDER — EZETIMIBE 10 MG PO TABS: 10.0000 mg | ORAL_TABLET | Freq: Every day | ORAL | 3 refills | Status: DC

## 2021-01-08 NOTE — Telephone Encounter (Signed)
Able to reach pt regarding his recent CT Calcium Score Dr. Rockey Situ had a chance to review his results and advised   "CT coronary calcium scoring  Score 184  Little bit of calcification noted in all vessels but predominantly in the LAD  Would consider adding Zetia to his Lipitor to achieve goal LDL less than 70   Small nodule in the lung  As he is not a smoker, very low risk of this being something "  Joseph Dalton will pick up newly order Zetia at his local pharmacy, agreed to try for at least 30-days to make sure no side affects, as this was also review with pt. Will f/u with Joseph Dalton regarding the lung nodule if he feels there is anything concerning like SOB or lung pain. Otherwise all questions or concerns were address and no additional concerns at this time. Agreeable to plan, will call back for anything further.    Zetia 10 mg QD sent in to [facility]

## 2021-01-08 NOTE — Telephone Encounter (Signed)
Pt returning phone call... please advise  

## 2021-03-09 ENCOUNTER — Encounter: Payer: Self-pay | Admitting: Internal Medicine

## 2021-03-09 ENCOUNTER — Ambulatory Visit: Payer: BC Managed Care – PPO | Admitting: Internal Medicine

## 2021-03-09 ENCOUNTER — Other Ambulatory Visit: Payer: Self-pay

## 2021-03-09 VITALS — BP 122/84 | HR 64 | Temp 96.3°F | Ht 74.0 in | Wt 287.8 lb

## 2021-03-09 DIAGNOSIS — E782 Mixed hyperlipidemia: Secondary | ICD-10-CM | POA: Diagnosis not present

## 2021-03-09 DIAGNOSIS — Z0000 Encounter for general adult medical examination without abnormal findings: Secondary | ICD-10-CM

## 2021-03-09 DIAGNOSIS — E669 Obesity, unspecified: Secondary | ICD-10-CM

## 2021-03-09 DIAGNOSIS — Z125 Encounter for screening for malignant neoplasm of prostate: Secondary | ICD-10-CM | POA: Diagnosis not present

## 2021-03-09 DIAGNOSIS — R5383 Other fatigue: Secondary | ICD-10-CM | POA: Diagnosis not present

## 2021-03-09 DIAGNOSIS — N62 Hypertrophy of breast: Secondary | ICD-10-CM

## 2021-03-09 DIAGNOSIS — I10 Essential (primary) hypertension: Secondary | ICD-10-CM

## 2021-03-09 DIAGNOSIS — R7301 Impaired fasting glucose: Secondary | ICD-10-CM | POA: Diagnosis not present

## 2021-03-09 LAB — CBC WITH DIFFERENTIAL/PLATELET
Basophils Absolute: 0 10*3/uL (ref 0.0–0.1)
Basophils Relative: 0.5 % (ref 0.0–3.0)
Eosinophils Absolute: 0.1 10*3/uL (ref 0.0–0.7)
Eosinophils Relative: 2.3 % (ref 0.0–5.0)
HCT: 46.5 % (ref 39.0–52.0)
Hemoglobin: 15.6 g/dL (ref 13.0–17.0)
Lymphocytes Relative: 21.3 % (ref 12.0–46.0)
Lymphs Abs: 1.3 10*3/uL (ref 0.7–4.0)
MCHC: 33.4 g/dL (ref 30.0–36.0)
MCV: 89 fl (ref 78.0–100.0)
Monocytes Absolute: 0.5 10*3/uL (ref 0.1–1.0)
Monocytes Relative: 8.5 % (ref 3.0–12.0)
Neutro Abs: 4.1 10*3/uL (ref 1.4–7.7)
Neutrophils Relative %: 67.4 % (ref 43.0–77.0)
Platelets: 184 10*3/uL (ref 150.0–400.0)
RBC: 5.22 Mil/uL (ref 4.22–5.81)
RDW: 13.6 % (ref 11.5–15.5)
WBC: 6.1 10*3/uL (ref 4.0–10.5)

## 2021-03-09 LAB — COMPREHENSIVE METABOLIC PANEL
ALT: 34 U/L (ref 0–53)
AST: 22 U/L (ref 0–37)
Albumin: 4.1 g/dL (ref 3.5–5.2)
Alkaline Phosphatase: 90 U/L (ref 39–117)
BUN: 17 mg/dL (ref 6–23)
CO2: 30 mEq/L (ref 19–32)
Calcium: 9.3 mg/dL (ref 8.4–10.5)
Chloride: 102 mEq/L (ref 96–112)
Creatinine, Ser: 1.1 mg/dL (ref 0.40–1.50)
GFR: 73.7 mL/min (ref >60.00)
Glucose, Bld: 101 mg/dL — ABNORMAL HIGH (ref 70–99)
Potassium: 4 mEq/L (ref 3.5–5.1)
Sodium: 140 mEq/L (ref 135–145)
Total Bilirubin: 0.8 mg/dL (ref 0.2–1.2)
Total Protein: 6.6 g/dL (ref 6.0–8.3)

## 2021-03-09 LAB — LIPID PANEL
Cholesterol: 180 mg/dL (ref 0–200)
HDL: 39.9 mg/dL (ref >39.00)
LDL Cholesterol: 115 mg/dL — ABNORMAL HIGH (ref 0–99)
NonHDL: 140.51
Total CHOL/HDL Ratio: 5
Triglycerides: 129 mg/dL (ref 0.0–149.0)
VLDL: 25.8 mg/dL (ref 0.0–40.0)

## 2021-03-09 LAB — PSA: PSA: 1.39 ng/mL (ref 0.10–4.00)

## 2021-03-09 LAB — TSH: TSH: 2.33 u[IU]/mL (ref 0.35–5.50)

## 2021-03-09 NOTE — Patient Instructions (Addendum)
Try using [employer] to fill up before dinner  30 minutes of cardio daily is your goal 5 days per week    24 lbs over the next 6 months   You need to start taking a baby aspirin daily to lower your risk of heart attack  Our goal LDL is 70;  if we are not there I would recommend starting the Zetia that Dr Rockey Situ gave you    Blood pressure should be 130/80 or less .  Start checking at home

## 2021-03-09 NOTE — Progress Notes (Addendum)
Patient ID: Joseph Dalton, male    DOB: 07/25/1960  Age: 60 y.o. MRN: [id number]  The patient is here for annual preventive  examination and management of other chronic and acute problems.  This visit occurred during the SARS-CoV-2 public health emergency.  Safety protocols were in place, including screening questions prior to the visit, additional usage of staff PPE, and extensive cleaning of exam room while observing appropriate contact time as indicated for disinfecting solutions.     The risk factors are reflected in the social history.  The roster of all physicians providing medical care to patient - is listed in the Snapshot section of the chart.  Activities of daily living:  The patient is 100% independent in all ADLs: dressing, toileting, feeding as well as independent mobility  Home safety : The patient has smoke detectors in the home. They wear seatbelts.  There are no firearms at home. There is no violence in the home.   There is no risks for hepatitis, STDs or HIV. There is no   history of blood transfusion. They have no travel history to infectious disease endemic areas of the world.  The patient has seen their dentist in the last six month. They have seen their eye doctor in the last year.  He denies  difficulty with regard to whispered voices and some television programs.  They have deferred audiologic testing in the last year.  They do not  have excessive sun exposure. Discussed the need for sun protection: hats, long sleeves and use of sunscreen if there is significant sun exposure.   Diet: the importance of a healthy diet is discussed. They do have a healthy diet.  The benefits of regular aerobic exercise were discussed. He is physically active at work and at [employer]    Depression screen: there are no signs or vegative symptoms of depression- irritability, change in appetite, anhedonia, sadness/tearfullness.  Cognitive assessment: the patient manages all their financial and  personal affairs and is actively engaged. They could relate day,date,year and events; recalled 2/3 objects at 3 minutes; performed clock-face test normally.  The following portions of the patient's history were reviewed and updated as appropriate: allergies, current medications, past family history, past medical history,  past surgical history, past social history  and problem list.  Visual acuity was not assessed per patient preference since she has regular follow up with her ophthalmologist. Hearing and body mass index were assessed and reviewed.   During the course of the visit the patient was educated and counseled about appropriate screening and preventive services including : fall prevention , diabetes screening, nutrition counseling, colorectal cancer screening, and recommended immunizations.    CC: The primary encounter diagnosis was Impaired fasting glucose. Diagnoses of Mixed hyperlipidemia, Primary hypertension, Prostate cancer screening, Other fatigue, Gynecomastia, male, Obesity (BMI 30-39.9), and Routine general medical examination at a health care facility were also pertinent to this visit.  1) obesity:  weight gain discussed since last visit.  Diet reviewed:  snacks on"pack of Nabs",  nuts, increased water intake and developed constipation (less than one BM daily). Discussed paying attention to portion size.  Currently his largest meal is dinner time and he is inactive in the evenings   2) CAD : he underwent noninvasive risk evaluation recently with Dr Rockey Situ.  Reviewed coronary calcium score via cardiac  CT   3) Patient is taking her medications as prescribed and notes no adverse effects.  Home BP readings have not been done but was recently  <  130/80 at another physician's office. He is avoiding added salt in  his diet bt not exercising regularly   .   History Joseph Dalton has a past medical history of HTN (hypertension), Hyperlipemia, and Venous insufficiency.   He has a past surgical  history that includes No past surgeries; Colonoscopy with propofol (N/A, 06/15/2018); and Extracorporeal shock wave lithotripsy (Right, 02/17/2020).   His family history includes Heart disease (age of onset: 38) in his father; Hyperlipidemia in his mother; Stroke in his mother.He reports that he has never smoked. His smokeless tobacco use includes chew. He reports current alcohol use of about 1.0 standard drink per week. He reports that he does not use drugs.  Outpatient Medications Prior to Visit  Medication Sig Dispense Refill   amLODipine (NORVASC) 10 MG tablet TAKE (1) TABLET BY MOUTH DAILY AT BEDTIME 90 tablet 3   atorvastatin (LIPITOR) 20 MG tablet TAKE ONE TABLET BY MOUTH DAILY AT BEDTIME. 90 tablet 3   cabergoline (DOSTINEX) 0.5 MG tablet Take 0.5 tablets by mouth once a week.     cyclobenzaprine (FLEXERIL) 10 MG tablet Take 1 tablet (10 mg total) by mouth 3 (three) times daily as needed for muscle spasms. 30 tablet 0   ezetimibe (ZETIA) 10 MG tablet Take 1 tablet (10 mg total) by mouth daily. 90 tablet 3   Multiple Vitamins-Minerals (MULTIVITAMIN WITH MINERALS) tablet Take 1 tablet by mouth daily.     triamterene-hydrochlorothiazide (MAXZIDE-25) 37.5-25 MG tablet TAKE ONE (1) TABLET BY MOUTH ONCE DAILY 90 tablet 3   doxycycline (VIBRA-TABS) 100 MG tablet Take 1 tablet (100 mg total) by mouth 2 (two) times daily. 14 tablet 0   No facility-administered medications prior to visit.    Review of Systems  Patient denies headache, fevers, malaise, unintentional weight loss, skin rash, eye pain, sinus congestion and sinus pain, sore throat, dysphagia,  hemoptysis , cough, dyspnea, wheezing, chest pain, palpitations, orthopnea, edema, abdominal pain, nausea, melena, diarrhea, constipation, flank pain, dysuria, hematuria, urinary  Frequency, nocturia, numbness, tingling, seizures,  Focal weakness, Loss of consciousness,  Tremor, insomnia, depression, anxiety, and suicidal ideation.     Objective:   BP 122/84 (BP Location: Left Arm, Patient Position: Sitting, Cuff Size: Large)   Pulse 64   Temp (!) 96.3 F (35.7 C) (Temporal)   Ht _0  (1.88 m)   Wt 287 lb 12.8 oz (130.5 kg)   SpO2 98%   BMI 36.95 kg/m   Physical Exam  General appearance: alert, cooperative and appears stated age Ears: normal TM's and external ear canals both ears Throat: lips, mucosa, and tongue normal; teeth and gums normal Neck: no adenopathy, no carotid bruit, supple, symmetrical, trachea midline and thyroid not enlarged, symmetric, no tenderness/mass/nodules Back: symmetric, no curvature. ROM normal. No CVA tenderness. Lungs: clear to auscultation bilaterally Heart: regular rate and rhythm, S1, S2 normal, no murmur, click, rub or gallop Abdomen: soft, non-tender; bowel sounds normal; no masses,  no organomegaly Pulses: 2+ and symmetric Skin: Skin color, texture, turgor normal. No rashes or lesions Lymph nodes: Cervical, supraclavicular, and axillary nodes normal.   Assessment & Plan:   Problem List Items Addressed This Visit     Gynecomastia, male    Secondary to hyperprolactinemia; managed with cabergergoline.  follow up on Endocrinologist      Hyperlipidemia    Untreated 10 yr risk of events is 15% by [facility] cardiac risk calculator,  But he had a cardiac CT and his calcium score was quite hith.  He has  not added Zetia per Dr. Rockey Situ. He has been taking atorvastatin 20 mg daily. Recommend adding zetia for goal LDL of 70 .   Lab Results  Component Value Date   CHOL 180 03/09/2021   HDL 39.90 03/09/2021   LDLCALC 115 (H) 03/09/2021   LDLDIRECT 98.0 06/03/2016   TRIG 129.0 03/09/2021   CHOLHDL 5 03/09/2021   Lab Results  Component Value Date   ALT 34 03/09/2021   AST 22 03/09/2021   ALKPHOS 90 03/09/2021   BILITOT 0.8 03/09/2021         Relevant Orders   Lipid Profile (Completed)   Impaired fasting glucose - Primary   Relevant Orders   Comp Met (CMET) (Completed)   Obesity (BMI  30-39.9)    Weight gain addressed.  Diet and exercise reviewed in detail.  He is not exercising because he is physically active on his job. I explained the difference between physical labor and aerobic exercise and weight training and the benefits to regular exercise . Given his prediavetic state,  Recommended low glycemic index diet       Routine general medical examination at a health care facility    age appropriate education and counseling updated, referrals for preventative services and immunizations addressed, dietary and smoking counseling addressed, most recent labs reviewed.  I have personally reviewed and have noted:   1) the patient's medical and social history 2) The pt's use of alcohol, tobacco, and illicit drugs 3) The patient's current medications and supplements 4) Functional ability including ADL's, fall risk, home safety risk, hearing and visual impairment 5) Diet and physical activities 6) Evidence for depression or mood disorder 7) The patient's height, weight, and BMI have been recorded in the chart   I have made referrals, and provided counseling and education based on review of the above      Other Visit Diagnoses     Primary hypertension       Relevant Orders   Comp Met (CMET) (Completed)   Prostate cancer screening       Relevant Orders   PSA (Completed)   Other fatigue       Relevant Orders   CBC with Differential/Platelet (Completed)   TSH (Completed)       I have discontinued Joseph L. Tiu "Bob"'s doxycycline. I am also having him maintain his multivitamin with minerals, cyclobenzaprine, cabergoline, triamterene-hydrochlorothiazide, atorvastatin, amLODipine, and ezetimibe.  No orders of the defined types were placed in this encounter.   Medications Discontinued During This Encounter  Medication Reason   doxycycline (VIBRA-TABS) 100 MG tablet     Follow-up: Return in about 6 months (around 09/07/2021).   [name], MD

## 2021-03-10 NOTE — Assessment & Plan Note (Addendum)
Untreated 10 yr risk of events is 15% by [facility] cardiac risk calculator,  But he had a cardiac CT and his calcium score was quite hith.  He has not added Zetia per Dr. Rockey Situ. He has been taking atorvastatin 20 mg daily. Recommend adding zetia for goal LDL of 70 .   Lab Results  Component Value Date   CHOL 180 03/09/2021   HDL 39.90 03/09/2021   LDLCALC 115 (H) 03/09/2021   LDLDIRECT 98.0 06/03/2016   TRIG 129.0 03/09/2021   CHOLHDL 5 03/09/2021   Lab Results  Component Value Date   ALT 34 03/09/2021   AST 22 03/09/2021   ALKPHOS 90 03/09/2021   BILITOT 0.8 03/09/2021

## 2021-03-10 NOTE — Assessment & Plan Note (Signed)

## 2021-03-10 NOTE — Assessment & Plan Note (Signed)
Weight gain addressed.  Diet and exercise reviewed in detail.  He is not exercising because he is physically active on his job. I explained the difference between physical labor and aerobic exercise and weight training and the benefits to regular exercise . Given his prediavetic state,  Recommended low glycemic index diet

## 2021-03-10 NOTE — Assessment & Plan Note (Addendum)
Secondary to hyperprolactinemia; managed with cabergergoline.  follow up on Endocrinologist

## 2021-04-12 NOTE — Addendum Note (Signed)
Addended by: [name] on: 04/12/2021 05:32 PM   Modules accepted: Level of Service

## 2021-09-06 ENCOUNTER — Encounter: Payer: Self-pay | Admitting: Internal Medicine

## 2021-09-07 ENCOUNTER — Ambulatory Visit: Payer: BC Managed Care – PPO | Admitting: Internal Medicine

## 2021-09-07 ENCOUNTER — Encounter: Payer: Self-pay | Admitting: Internal Medicine

## 2021-09-07 VITALS — BP 124/86 | HR 66 | Temp 97.7°F | Ht 74.0 in | Wt 273.6 lb

## 2021-09-07 DIAGNOSIS — Z23 Encounter for immunization: Secondary | ICD-10-CM | POA: Diagnosis not present

## 2021-09-07 DIAGNOSIS — E876 Hypokalemia: Secondary | ICD-10-CM

## 2021-09-07 DIAGNOSIS — I10 Essential (primary) hypertension: Secondary | ICD-10-CM

## 2021-09-07 DIAGNOSIS — M5386 Other specified dorsopathies, lumbar region: Secondary | ICD-10-CM | POA: Diagnosis not present

## 2021-09-07 DIAGNOSIS — Z114 Encounter for screening for human immunodeficiency virus [HIV]: Secondary | ICD-10-CM

## 2021-09-07 DIAGNOSIS — E782 Mixed hyperlipidemia: Secondary | ICD-10-CM

## 2021-09-07 DIAGNOSIS — E221 Hyperprolactinemia: Secondary | ICD-10-CM

## 2021-09-07 DIAGNOSIS — R7301 Impaired fasting glucose: Secondary | ICD-10-CM | POA: Diagnosis not present

## 2021-09-07 LAB — COMPREHENSIVE METABOLIC PANEL
ALT: 28 U/L (ref 0–53)
AST: 21 U/L (ref 0–37)
Albumin: 4 g/dL (ref 3.5–5.2)
Alkaline Phosphatase: 93 U/L (ref 39–117)
BUN: 17 mg/dL (ref 6–23)
CO2: 31 mEq/L (ref 19–32)
Calcium: 8.9 mg/dL (ref 8.4–10.5)
Chloride: 103 mEq/L (ref 96–112)
Creatinine, Ser: 0.89 mg/dL (ref 0.40–1.50)
GFR: 93.76 mL/min (ref >60.00)
Glucose, Bld: 102 mg/dL — ABNORMAL HIGH (ref 70–99)
Potassium: 3.3 mEq/L — ABNORMAL LOW (ref 3.5–5.1)
Sodium: 141 mEq/L (ref 135–145)
Total Bilirubin: 0.7 mg/dL (ref 0.2–1.2)
Total Protein: 6.4 g/dL (ref 6.0–8.3)

## 2021-09-07 LAB — LDL CHOLESTEROL, DIRECT: Direct LDL: 89 mg/dL

## 2021-09-07 LAB — MICROALBUMIN / CREATININE URINE RATIO
Creatinine,U: 111.2 mg/dL
Microalb Creat Ratio: 1.3 mg/g (ref 0.0–30.0)
Microalb, Ur: 1.5 mg/dL (ref 0.0–1.9)

## 2021-09-07 LAB — LIPID PANEL
Cholesterol: 141 mg/dL (ref 0–200)
HDL: 42 mg/dL (ref >39.00)
LDL Cholesterol: 85 mg/dL (ref 0–99)
NonHDL: 99.21
Total CHOL/HDL Ratio: 3
Triglycerides: 71 mg/dL (ref 0.0–149.0)
VLDL: 14.2 mg/dL (ref 0.0–40.0)

## 2021-09-07 MED ORDER — ZOSTER VAC RECOMB ADJUVANTED 50 MCG/0.5ML IM SUSR: 0.5000 mL | Freq: Once | INTRAMUSCULAR | 1 refills | Status: AC

## 2021-09-07 MED ORDER — EZETIMIBE 10 MG PO TABS: 10.0000 mg | ORAL_TABLET | Freq: Every day | ORAL | 3 refills | Status: DC

## 2021-09-07 NOTE — Assessment & Plan Note (Signed)
Home readings have been < 130/90

## 2021-09-07 NOTE — Progress Notes (Signed)
Subjective:  Patient ID: Joseph Dalton, male    DOB: June 29, 1960  Age: 61 y.o. MRN: [id number]  CC: The primary encounter diagnosis was Mixed hyperlipidemia. Diagnoses of Impaired fasting glucose, Primary hypertension, Sciatica of left side associated with disorder of lumbar spine, White coat syndrome with diagnosis of hypertension, Hyperprolactinemia ([place]), Screening for HIV (human immunodeficiency virus), Need for Tdap vaccination, and Hypokalemia were also pertinent to this visit.   HPI [name] presents for  Chief Complaint  Patient presents with   Follow-up    6 month follow up on hypertension and hyperlipidemia    1) obesity: has reduced 15 lbs since last  visit by reducing portion size and restricting sodas to one daily at most.   2) CAD :  taking lipitor and zetia since last visit .  Denies chest pain, claudication.  Still chewing tobacco .  Brother's CAC score was zero .  He defers testing.   3) HTN:  Hypertension: patient checks blood pressure twice weekly at home.  Readings have been for the most part < 130/90 at rest . Patient is following a reduced salt diet most days and is taking medications as prescribed  .  No regular exercise because he has a physically demanding job.  4) Hyperprolactinemia:  has stopped taking cabergoline, has not had follow up with endocrinology because he did not see any change in gynecomastia while on the medication   Lab Results  Component Value Date   CHOL 141 09/07/2021   HDL 42.00 09/07/2021   LDLCALC 85 09/07/2021   LDLDIRECT 89.0 09/07/2021   TRIG 71.0 09/07/2021   CHOLHDL 3 09/07/2021     Outpatient Medications Prior to Visit  Medication Sig Dispense Refill   amLODipine (NORVASC) 10 MG tablet TAKE (1) TABLET BY MOUTH DAILY AT BEDTIME 90 tablet 3   cabergoline (DOSTINEX) 0.5 MG tablet Take 0.5 tablets by mouth once a week.     cyclobenzaprine (FLEXERIL) 10 MG tablet Take 1 tablet (10 mg total) by mouth 3 (three) times daily as  needed for muscle spasms. 30 tablet 0   Multiple Vitamins-Minerals (MULTIVITAMIN WITH MINERALS) tablet Take 1 tablet by mouth daily.     triamterene-hydrochlorothiazide (MAXZIDE-25) 37.5-25 MG tablet TAKE ONE (1) TABLET BY MOUTH ONCE DAILY 90 tablet 3   atorvastatin (LIPITOR) 20 MG tablet TAKE ONE TABLET BY MOUTH DAILY AT BEDTIME. 90 tablet 3   ezetimibe (ZETIA) 10 MG tablet Take 1 tablet (10 mg total) by mouth daily. 90 tablet 3   No facility-administered medications prior to visit.    Review of Systems;  Patient denies headache, fevers, malaise, unintentional weight loss, skin rash, eye pain, sinus congestion and sinus pain, sore throat, dysphagia,  hemoptysis , cough, dyspnea, wheezing, chest pain, palpitations, orthopnea, edema, abdominal pain, nausea, melena, diarrhea, constipation, flank pain, dysuria, hematuria, urinary  Frequency, nocturia, numbness, tingling, seizures,  Focal weakness, Loss of consciousness,  Tremor, insomnia, depression, anxiety, and suicidal ideation.      Objective:  BP 124/86 (BP Location: Left Arm, Patient Position: Sitting, Cuff Size: Normal)   Pulse 66   Temp 97.7 F (36.5 C) (Oral)   Ht _0  (1.88 m)   Wt 273 lb 9.6 oz (124.1 kg)   SpO2 94%   BMI 35.13 kg/m   BP Readings from Last 3 Encounters:  09/07/21 124/86  03/09/21 122/84  12/26/20 130/82    Wt Readings from Last 3 Encounters:  09/07/21 273 lb 9.6 oz (124.1 kg)  03/09/21  287 lb 12.8 oz (130.5 kg)  12/26/20 281 lb 8 oz (127.7 kg)    General appearance: alert, cooperative and appears stated age Ears: normal TM's and external ear canals both ears Throat: lips, mucosa, and tongue normal; teeth and gums normal Neck: no adenopathy, no carotid bruit, supple, symmetrical, trachea midline and thyroid not enlarged, symmetric, no tenderness/mass/nodules Back: symmetric, no curvature. ROM normal. No CVA tenderness. Lungs: clear to auscultation bilaterally Heart: regular rate and rhythm, S1, S2  normal, no murmur, click, rub or gallop Abdomen: soft, non-tender; bowel sounds normal; no masses,  no organomegaly Pulses: 2+ and symmetric Skin: Skin color, texture, turgor normal. No rashes or lesions Lymph nodes: Cervical, supraclavicular, and axillary nodes normal.  Lab Results  Component Value Date   HGBA1C 5.4 09/07/2020   HGBA1C 5.4 01/05/2020   HGBA1C 5.3 12/31/2018    Lab Results  Component Value Date   CREATININE 0.89 09/07/2021   CREATININE 1.10 03/09/2021   CREATININE 1.01 09/07/2020    Lab Results  Component Value Date   WBC 6.1 03/09/2021   HGB 15.6 03/09/2021   HCT 46.5 03/09/2021   PLT 184.0 03/09/2021   GLUCOSE 102 (H) 09/07/2021   CHOL 141 09/07/2021   TRIG 71.0 09/07/2021   HDL 42.00 09/07/2021   LDLDIRECT 89.0 09/07/2021   LDLCALC 85 09/07/2021   ALT 28 09/07/2021   AST 21 09/07/2021   NA 141 09/07/2021   K 3.3 (L) 09/07/2021   CL 103 09/07/2021   CREATININE 0.89 09/07/2021   BUN 17 09/07/2021   CO2 31 09/07/2021   TSH 2.33 03/09/2021   PSA 1.39 03/09/2021   HGBA1C 5.4 09/07/2020   MICROALBUR 1.5 09/07/2021    CT CARDIAC SCORING (SELF PAY ONLY)  Addendum Date: 01/01/2021   ADDENDUM REPORT: 01/01/2021 17:17 CLINICAL DATA:  Risk stratification EXAM: Coronary Calcium Score TECHNIQUE: The patient was scanned on a Siemens Somatom go.Top Scanner. Axial non-contrast 3 mm slices were carried out through the heart. The data set was analyzed on a dedicated work station and scored using the [place]. FINDINGS: Non-cardiac: See separate report from [facility]. Ascending Aorta: Normal size Pericardium: Normal Coronary arteries: Normal origin of left and right coronary arteries. Distribution of arterial calcifications if present, as noted below; LM 0 LAD 172 LCx 6.48 RCA 5.38 Total 184 IMPRESSION AND RECOMMENDATION: 1. Coronary calcium score of 184. This was 82nd percentile for age and sex matched control. 2. CAC 100-299 in LAD, LCx, RCA. CAC-DRS  A2/N3. 3. Recommend aspirin and statin if no contraindications. 4. Continue heart healthy lifestyle and risk factor modification. Kate Sable Electronically Signed   By: Kate Sable M.D.   On: 01/01/2021 17:17   Result Date: 01/01/2021 EXAM: OVER-READ INTERPRETATION  CT CHEST The following report is an over-read performed by radiologist Dr. Vinnie Langton of [facility], [place] on 01/01/2021. This over-read does not include interpretation of cardiac or coronary anatomy or pathology. The coronary calcium score interpretation by the cardiologist is attached. COMPARISON:  None. FINDINGS: Atherosclerotic calcifications in the thoracic aorta. Scattered areas of linear scarring are noted in the visualized portions of the lungs. 4 mm pulmonary nodule in the left lower lobe (axial image 10 of series 10). Within the visualized portions of the thorax there are no other larger more suspicious appearing pulmonary nodules or masses, there is no acute consolidative airspace disease, no pleural effusions, no pneumothorax and no lymphadenopathy. Visualized portions of the upper abdomen demonstrates numerous subcentimeter low-attenuation lesions scattered throughout the  visualized hepatic parenchyma, incompletely characterized on today's non-contrast CT examination, but statistically likely to represent tiny cysts or biliary hamartomas. There are no aggressive appearing lytic or blastic lesions noted in the visualized portions of the skeleton. IMPRESSION: 1. 4 mm nodule in the superior segment of the left lower lobe, nonspecific, but statistically likely benign. No follow-up needed if patient is low-risk. Non-contrast chest CT can be considered in 12 months if patient is high-risk. This recommendation follows the consensus statement: Guidelines for Management of Incidental Pulmonary Nodules Detected on CT Images: From the Fleischner Society 2017; Radiology 2017; 284:228-243. 2. Aortic Atherosclerosis  (ICD10-I70.0). Electronically Signed: By: Vinnie Langton M.D. On: 01/01/2021 08:50    Assessment & Plan:   Problem List Items Addressed This Visit     Hyperlipidemia - Primary    Taking lipitor and zetia for goal LDL < 70 given high CAC score . Advised him to increase dose of atorvastatin to 40 mg daily   Lab Results  Component Value Date   CHOL 141 09/07/2021   HDL 42.00 09/07/2021   LDLCALC 85 09/07/2021   LDLDIRECT 89.0 09/07/2021   TRIG 71.0 09/07/2021   CHOLHDL 3 09/07/2021         Relevant Medications   ezetimibe (ZETIA) 10 MG tablet   atorvastatin (LIPITOR) 40 MG tablet   Other Relevant Orders   Lipid Profile (Completed)   Direct LDL (Completed)   Hyperprolactinemia ([place])    Found during workup for gynecomastia. MRI was negative for macroadenoma.  Current level is ULN> .  Will resume carbegoline for level > 20.        Relevant Orders   Prolactin (Completed)   Hypokalemia    Secondary to mazide  Potassium supplement daily  for 5 days        Relevant Orders   Basic Metabolic Panel (BMET)   Impaired fasting glucose    He has had normal A1cs  And is restricting refined sugar in his diet   Lab Results  Component Value Date   HGBA1C 5.4 09/07/2020         Relevant Orders   Comp Met (CMET) (Completed)   Sciatica of left side associated with disorder of lumbar spine    No recent flares.  Uses flexeril prn spasm       White coat syndrome with diagnosis of hypertension    Home readings have been < 130/90        Relevant Medications   ezetimibe (ZETIA) 10 MG tablet   atorvastatin (LIPITOR) 40 MG tablet   Other Visit Diagnoses     Primary hypertension       Relevant Medications   ezetimibe (ZETIA) 10 MG tablet   atorvastatin (LIPITOR) 40 MG tablet   Other Relevant Orders   Comp Met (CMET) (Completed)   Microalbumin / creatinine urine ratio (Completed)   Screening for HIV (human immunodeficiency virus)       Relevant Orders   HIV Antibody  (routine testing w rflx)   Need for Tdap vaccination       Relevant Orders   Tdap vaccine greater than or equal to 7yo IM (Completed)       Follow-up: No follow-ups on file.   [name], MD

## 2021-09-07 NOTE — Patient Instructions (Signed)
We gave your your Tetanus-diptheria -whooping cough booster today (it's due every ten years)  Congrats on the weight loss !  Keep going!   No medication changes today unless there is protein in your urine   I'm checking your prolactin level to make sure you don't need to resume cabergoline

## 2021-09-07 NOTE — Assessment & Plan Note (Addendum)
Taking lipitor and zetia for goal LDL < 70 given high CAC score . Advised him to increase dose of atorvastatin to 40 mg daily   Lab Results  Component Value Date   CHOL 141 09/07/2021   HDL 42.00 09/07/2021   LDLCALC 85 09/07/2021   LDLDIRECT 89.0 09/07/2021   TRIG 71.0 09/07/2021   CHOLHDL 3 09/07/2021

## 2021-09-07 NOTE — Assessment & Plan Note (Addendum)
No recent flares.  Uses flexeril prn spasm

## 2021-09-09 DIAGNOSIS — E876 Hypokalemia: Secondary | ICD-10-CM | POA: Insufficient documentation

## 2021-09-09 MED ORDER — POTASSIUM CHLORIDE CRYS ER 20 MEQ PO TBCR: 20.0000 meq | EXTENDED_RELEASE_TABLET | Freq: Every day | ORAL | 0 refills | Status: DC

## 2021-09-09 MED ORDER — ATORVASTATIN CALCIUM 40 MG PO TABS: 40.0000 mg | ORAL_TABLET | Freq: Every day | ORAL | 1 refills | Status: DC

## 2021-09-09 NOTE — Assessment & Plan Note (Addendum)
Found during workup for gynecomastia. MRI was negative for macroadenoma.  Current level is ULN> .  Will resume carbegoline for level > 20.

## 2021-09-09 NOTE — Assessment & Plan Note (Signed)
Secondary to mazide  Potassium supplement daily  for 5 days

## 2021-09-09 NOTE — Assessment & Plan Note (Signed)
He has had normal A1cs  And is restricting refined sugar in his diet   Lab Results  Component Value Date   HGBA1C 5.4 09/07/2020

## 2021-09-10 LAB — HIV ANTIBODY (ROUTINE TESTING W REFLEX): HIV 1&2 Ab, 4th Generation: NONREACTIVE

## 2021-09-10 LAB — PROLACTIN: Prolactin: 18.7 ng/mL — ABNORMAL HIGH (ref 2.0–18.0)

## 2021-09-19 IMAGING — MR MR HEAD W/O CM
11 series · 41 of 48 positions shown · non-contrast
Comparison: None.

CLINICAL DATA: 59-year-old male with breast pain and elevated
prolactin.

EXAM:
MRI HEAD WITHOUT CONTRAST
TECHNIQUE: Multiplanar, multiecho pulse sequences of the brain and surrounding
structures were obtained without intravenous contrast.

[Series 5: T1 · sagittal · 5.0mm · 0.62mm/px · 2 of 25 slices shown (1 of 2)]
[im 1/25]
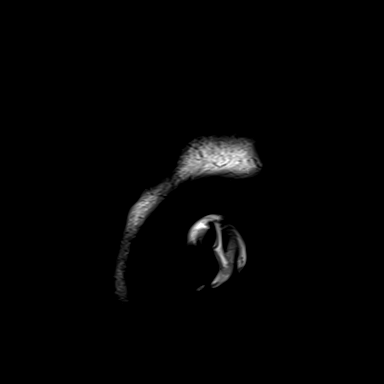
[im 25/25]
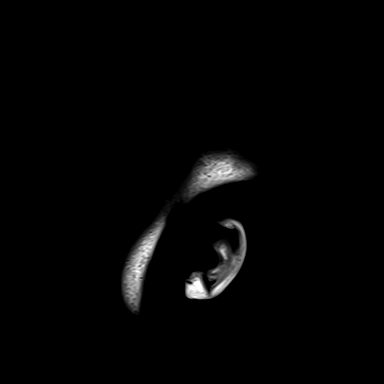

[Series 6: ax dwi_tracew · axial · 3.0mm · 0.60mm/px · z∈[-83,+78]mm · 4 of 50 slices shown]
[im 1/50]
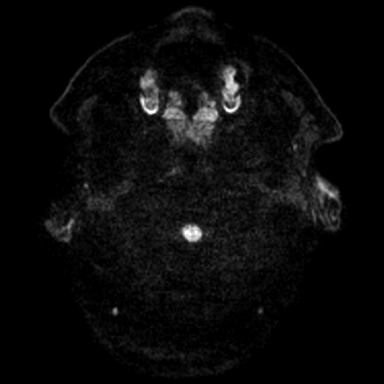
[im 17/50]
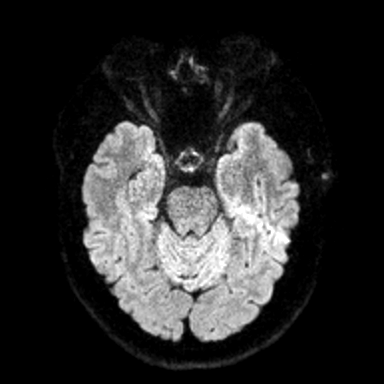
[im 33/50]
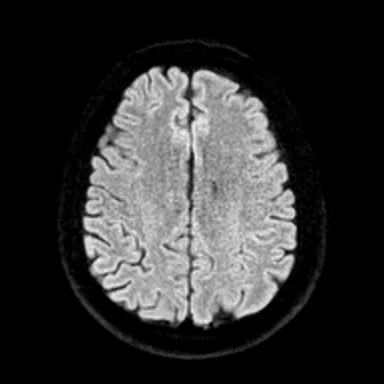
[im 50/50]
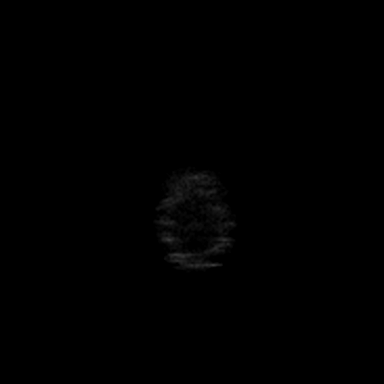

[Series 7: ax dwi_adc · axial · 3.0mm · 0.60mm/px · z∈[-83,+78]mm · 4 of 50 slices shown]
[im 1/50]
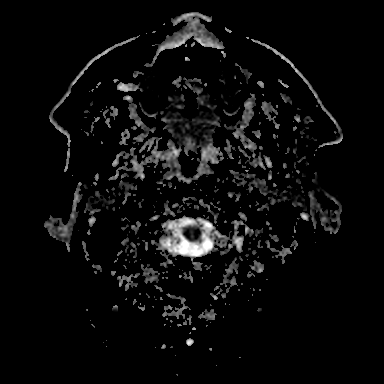
[im 17/50]
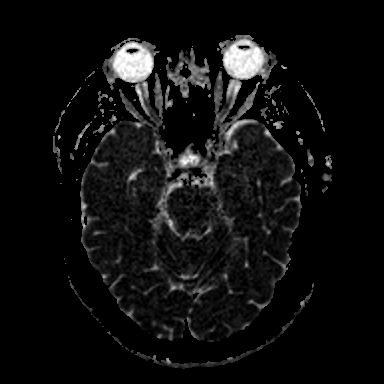
[im 33/50]
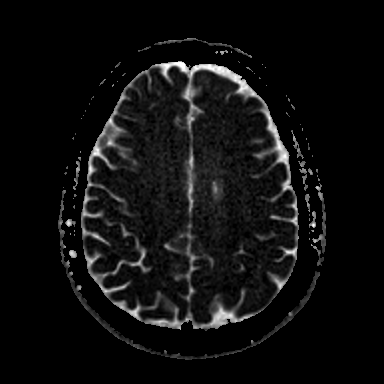
[im 50/50]
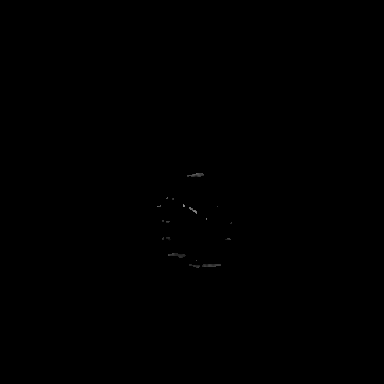

[Series 8: cor dwi_tracew · coronal · 5.0mm · 0.60mm/px · 3 of 38 slices shown]
[im 1/38]
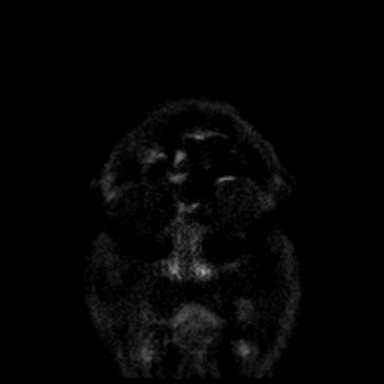
[im 19/38]
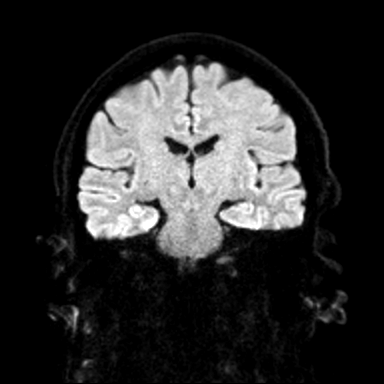
[im 38/38]
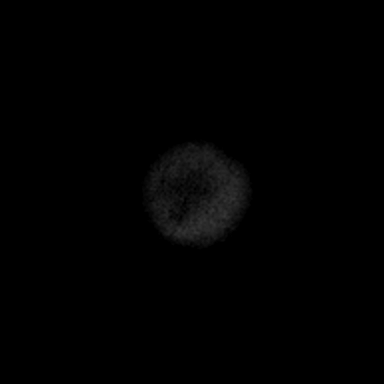

[Series 9: cor dwi_adc · coronal · 5.0mm · 0.60mm/px · 3 of 38 slices shown]
[im 1/38]
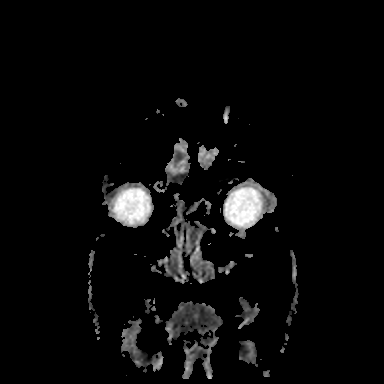
[im 19/38]
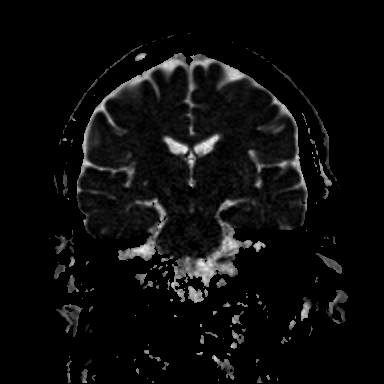
[im 38/38]
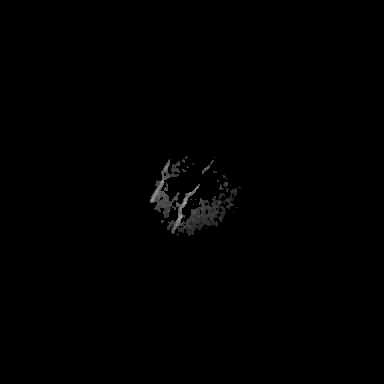

[Series 10: T2 · axial · 5.0mm · 0.53mm/px · z∈[-83,+79]mm · 2 of 28 slices shown (1 of 2)]
[im 1/28]
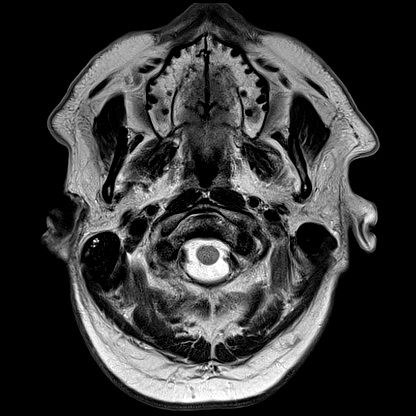
[im 28/28]
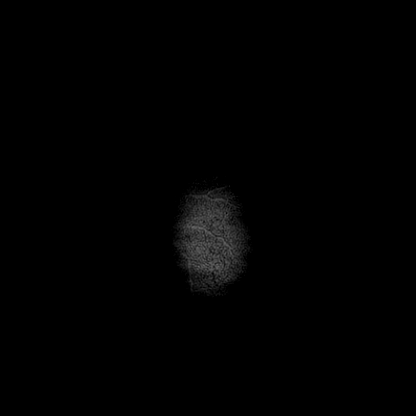

[Series 12: pha_images · axial · 3.0mm · 0.90mm/px · z∈[-91,+86]mm · 5 of 60 slices shown]
[im 1/60]
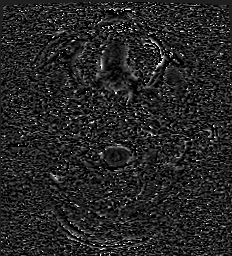
[im 15/60]
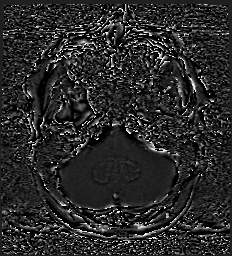
[im 30/60]
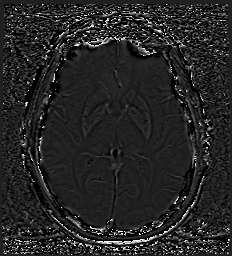
[im 45/60]
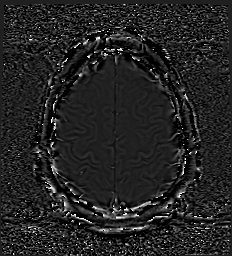
[im 60/60]
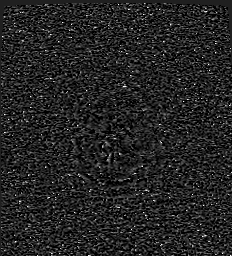

[Series 13: swi_images · axial · 3.0mm · 0.90mm/px · z∈[-91,+41]mm · 4 of 60 slices shown]
[im 1/60]
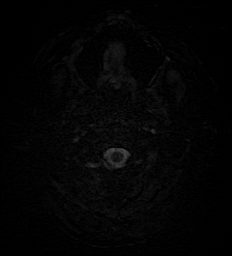
[im 15/60]
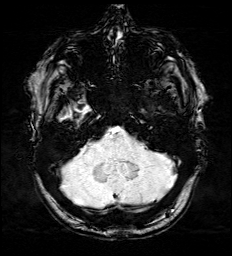
[im 30/60]
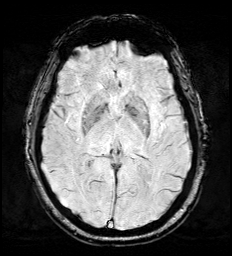
[im 45/60]
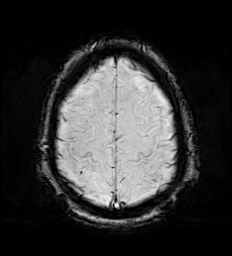

[Series 15: FLAIR · axial · 3.0mm · 0.53mm/px · z∈[-83,+79]mm · 4 of 55 slices shown]
[im 1/55]
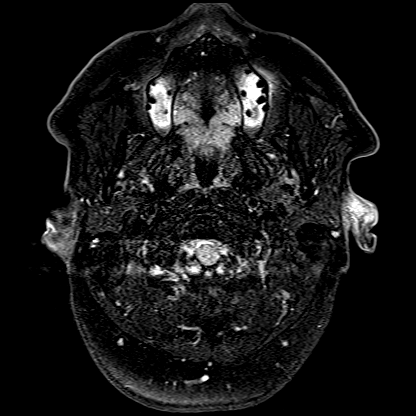
[im 19/55]
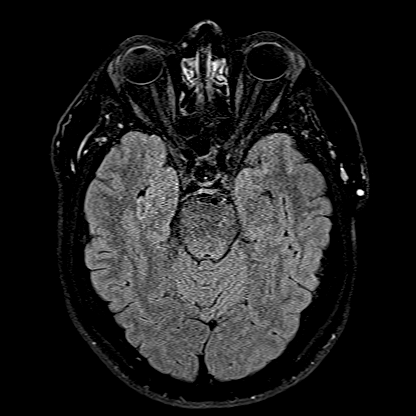
[im 37/55]
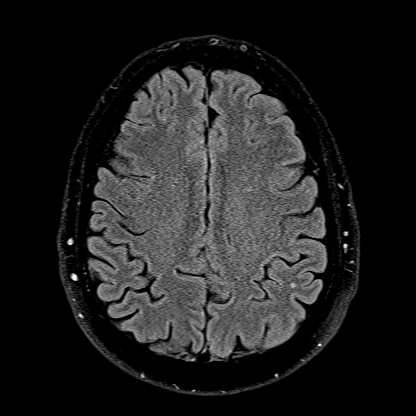
[im 55/55]
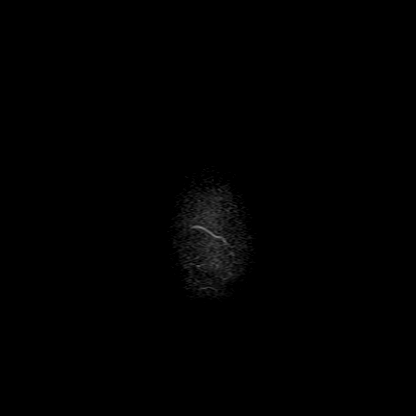

[Series 16: T1 · axial · 1.0mm · 0.98mm/px · z∈[-91,+82]mm · 8 of 174 slices shown (2 of 2)]
[im 1/174]
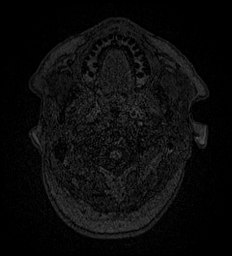
[im 27/174]
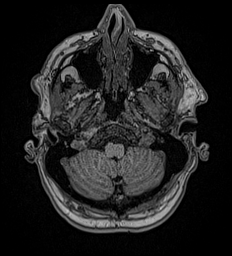
[im 54/174]
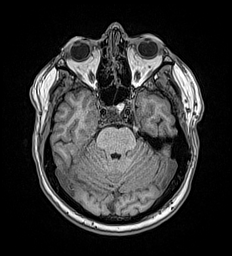
[im 80/174]
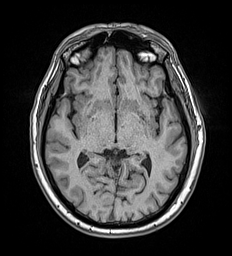
[im 94/174]
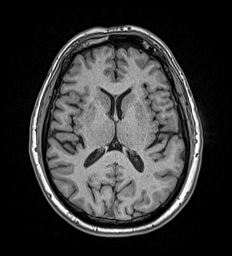
[im 120/174]
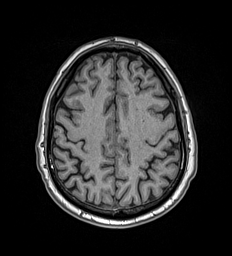
[im 147/174]
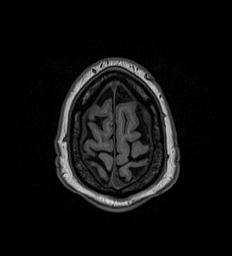
[im 174/174]
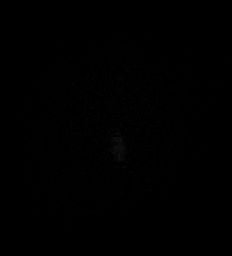

[Series 17: T2 · coronal · 5.0mm · 0.57mm/px · 2 of 29 slices shown (2 of 2)]
[im 1/29]
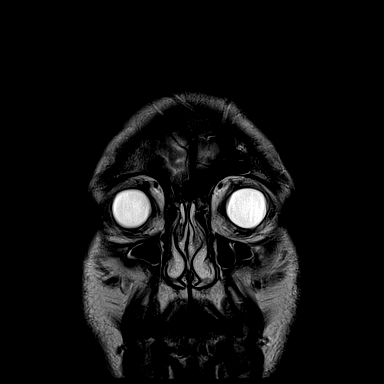
[im 29/29]
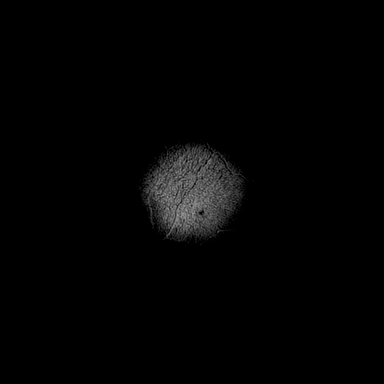

[41 of 48 positions shown; findings below may reference images not displayed]

FINDINGS: Brain: On these noncontrast images the pituitary is diminutive, but
overall size and configuration are within normal limits for age
(series 5, image 13 and series 17, image 20). There is no
suprasellar mass or mass effect. The infundibulum appears normal and
is in the midline. Unremarkable noncontrast appearance of the
cavernous sinus.

No restricted diffusion to suggest acute infarction. No midline
shift, mass effect, evidence of mass lesion, ventriculomegaly,
extra-axial collection or acute intracranial hemorrhage.
Cervicomedullary junction within normal limits. Cerebral volume is
within normal limits for age.

There is minor T2 and FLAIR heterogeneity in the anterior right
basal ganglia (series 10, image 16). Otherwise gray and white matter
signal is within normal limits for age. No cortical encephalomalacia
or chronic cerebral blood products.

Vascular: Major intracranial vascular flow voids are preserved.

Skull and upper cervical spine: Normal visible cervical spine.
Normal bone marrow signal.

Sinuses/Orbits: Incompletely pneumatized versus chronic mucous
retention cyst of the posterior left sphenoid sinus (series 10,
image 8 and series 5, image 13). Other paranasal sinuses are well
aerated. There might be a subtle chronic right lamina papyracea
fracture (series 10, image 10). Otherwise orbits appear normal.

Other: Mastoids are well aerated. Grossly normal visible internal
auditory structures. Scalp and face soft tissues appear negative.
IMPRESSION: 1. Diminutive pituitary, with a normal non-contrast appearance.
This excludes pituitary macroadenoma, and although less likely given
the diminutive gland size - a dedicated pituitary protocol Head MRI
without and with contrast would be necessary if microadenoma is a
concern.

2. No acute intracranial abnormality. Minor nonspecific signal
changes in the right basal ganglia, such as due to isolated chronic
small-vessel disease.

## 2021-11-19 ENCOUNTER — Other Ambulatory Visit: Payer: Self-pay | Admitting: Internal Medicine

## 2021-12-06 ENCOUNTER — Other Ambulatory Visit: Payer: Self-pay | Admitting: Internal Medicine

## 2022-01-30 ENCOUNTER — Encounter: Payer: Self-pay | Admitting: Family Medicine

## 2022-01-30 ENCOUNTER — Telehealth (INDEPENDENT_AMBULATORY_CARE_PROVIDER_SITE_OTHER): Payer: BC Managed Care – PPO | Admitting: Family Medicine

## 2022-01-30 VITALS — Ht 74.0 in

## 2022-01-30 DIAGNOSIS — U071 COVID-19: Secondary | ICD-10-CM

## 2022-01-30 NOTE — Progress Notes (Signed)
Virtual Visit via Video Note  Subjective  CC:  Chief Complaint  Patient presents with   Covid Positive    Pt stated that he tested positive for COVID on 01/29/2022     I connected with [name] on 01/30/22 at 11:30 AM EDT by a video enabled telemedicine application and verified that I am speaking with the correct person using two identifiers. Location patient: Home Location provider: [facility] [place], Office Persons participating in the virtual visit: Joseph Dalton, Leamon Arnt, MD Darlina Rumpf CMA  I discussed the limitations of evaluation and management by telemedicine and the availability of in person appointments. The patient expressed understanding and agreed to proceed. HPI: Joseph Dalton is a 61 y.o. male who was contacted today to address the problems listed above in the chief complaint. Sxs started yesterday: fever to 101 resolved with tylenol. HA, congestion and mild cough. Yesterday moderate to severe myalgias but much better today. Wife with same. Has HTN and HLD. No respiratory problems. Denies sob. Appettie is fair. Vaccinated.  Assessment  1. COVID-19      Plan  covid:  mild to moderate sxs early in course of illness. Education given on supportive care measures. Defers paxlovid. This is reasonable. Avoid decongestants. Ok antihistamines and cough meds;advil and tylenol he will notify us if sxs worsen. Discussed isolation recommendations.  I discussed the assessment and treatment plan with the patient. The patient was provided an opportunity to ask questions and all were answered. The patient agreed with the plan and demonstrated an understanding of the instructions.   The patient was advised to call back or seek an in-person evaluation if the symptoms worsen or if the condition fails to improve as anticipated. Follow up: prn  Visit date not found  No orders of the defined types were placed in this encounter.     I reviewed  the patients updated PMH, FH, and SocHx.    Patient Active Problem List   Diagnosis Date Noted   Hypokalemia 09/09/2021   Hyperprolactinemia ([place]) 05/21/2020   Gynecomastia, male 02/07/2020   Insomnia 12/31/2018   Nocturnal cough 12/31/2018   Impaired fasting glucose 12/31/2018   Sciatica of left side associated with disorder of lumbar spine 05/11/2015   Routine general medical examination at a health care facility 11/22/2013   Obesity (BMI 30-39.9) 11/22/2013   Hyperlipidemia 05/30/2011   White coat syndrome with diagnosis of hypertension 05/30/2011   Current Meds  Medication Sig   amLODipine (NORVASC) 10 MG tablet TAKE (1) TABLET BY MOUTH DAILY AT BEDTIME   atorvastatin (LIPITOR) 20 MG tablet TAKE ONE TABLET BY MOUTH DAILY AT BEDTIME.   atorvastatin (LIPITOR) 40 MG tablet Take 1 tablet (40 mg total) by mouth at bedtime.   cabergoline (DOSTINEX) 0.5 MG tablet Take 0.5 tablets by mouth once a week.   cyclobenzaprine (FLEXERIL) 10 MG tablet Take 1 tablet (10 mg total) by mouth 3 (three) times daily as needed for muscle spasms.   ezetimibe (ZETIA) 10 MG tablet Take 1 tablet (10 mg total) by mouth daily.   triamterene-hydrochlorothiazide (MAXZIDE-25) 37.5-25 MG tablet TAKE ONE (1) TABLET BY MOUTH ONCE DAILY    Allergies: Patient has No Known Allergies. Family History: Patient family history includes Heart disease (age of onset: 63) in his father; Hyperlipidemia in his mother; Stroke in his mother. Social History:  Patient  reports that he has never smoked. His smokeless tobacco use includes chew. He reports  current alcohol use of about 1.0 standard drink of alcohol per week. He reports that he does not use drugs.  Review of Systems: Constitutional: Negative for fever malaise or anorexia Cardiovascular: negative for chest pain Respiratory: negative for SOB or persistent cough Gastrointestinal: negative for abdominal pain  OBJECTIVE Vitals: Ht '6\' 2"'$  (1.88 m)   BMI 35.13 kg/m   General: no acute distress , A&Ox3 Appears well. No respiratory distress  Leamon Arnt, MD

## 2022-03-22 ENCOUNTER — Other Ambulatory Visit: Payer: Self-pay | Admitting: Internal Medicine

## 2022-03-22 DIAGNOSIS — Z20828 Contact with and (suspected) exposure to other viral communicable diseases: Secondary | ICD-10-CM

## 2022-03-22 MED ORDER — OSELTAMIVIR PHOSPHATE 75 MG PO CAPS: 75.0000 mg | ORAL_CAPSULE | Freq: Every day | ORAL | 0 refills | Status: AC

## 2022-03-22 NOTE — Assessment & Plan Note (Signed)
Prophylactic dose of tamiflu sent to [facility] (wife is infected)

## 2022-06-05 ENCOUNTER — Other Ambulatory Visit: Payer: Self-pay | Admitting: Family

## 2022-10-29 ENCOUNTER — Telehealth: Payer: Self-pay | Admitting: Cardiovascular Disease

## 2022-10-29 DIAGNOSIS — E782 Mixed hyperlipidemia: Secondary | ICD-10-CM

## 2022-10-29 NOTE — Telephone Encounter (Signed)
Pt called stating it's been 2 years since he had a calcium score and reported he would like to be scheduled for a repeat test from a preventive standpoint. Pt denies chest pain and stated he's not having alarming issues. He did report he doesn't have "the wind" he used to, but stated he isn't concerned.  Pt is scheduled to see Dr. Mariah Milling on 9/20. Pt inquiring if MD would like for him to have test done prior to appointment.    Will forward to MD for recommendations

## 2022-10-29 NOTE — Telephone Encounter (Signed)
Patient is requesting call back to discuss getting another CT Calcium Score test done and whatever test Dr. Mariah Milling believes he may need for preventative measures for patient. Requesting call back to discuss.

## 2022-11-04 NOTE — Telephone Encounter (Signed)
Called and spoke with patient. Informed him of the following from Dr. Mariah Milling.   Calcium score was relatively low when he did it at age 62  I suspect there will be minimal change after 2 years  He is certainly welcome to spend the money to have it done again but likely will have minimal change from before and a slight worsening may not change management  Thx  TG   Patient states that he would like the test. Orders placed for Ct Calcium score.

## 2022-11-12 ENCOUNTER — Ambulatory Visit
Admission: RE | Admit: 2022-11-12 | Discharge: 2022-11-12 | Disposition: A | Discharge status: Home / Self Care | Payer: BC Managed Care – PPO | Source: Ambulatory Visit | Attending: Cardiovascular Disease | Admitting: Cardiovascular Disease

## 2022-11-12 DIAGNOSIS — E782 Mixed hyperlipidemia: Secondary | ICD-10-CM | POA: Insufficient documentation

## 2022-11-14 ENCOUNTER — Encounter: Payer: Self-pay | Admitting: Cardiovascular Disease

## 2022-11-14 ENCOUNTER — Telehealth: Payer: Self-pay | Admitting: Emergency Medicine

## 2022-11-14 ENCOUNTER — Telehealth: Payer: Self-pay | Admitting: Cardiovascular Disease

## 2022-11-14 MED ORDER — EZETIMIBE 10 MG PO TABS: 10.0000 mg | ORAL_TABLET | Freq: Every day | ORAL | 3 refills | Status: DC

## 2022-11-14 MED ORDER — ATORVASTATIN CALCIUM 40 MG PO TABS: 40.0000 mg | ORAL_TABLET | Freq: Every day | ORAL | 3 refills | Status: DC

## 2022-11-14 NOTE — Telephone Encounter (Signed)
Called and spoke with patient. Informed him of the following from Dr. Mariah Milling.   CT calcium score with elevated numbers: Score 370 Would recommend more aggressive lipid management, Lipitor up to 40 mg daily and add zetia 10 mg daily Goal LDL <55  Patient verbalizes understanding. Prescription sent to preferred pharmacy.

## 2022-11-14 NOTE — Telephone Encounter (Signed)
-----   Message from Julien Nordmann sent at 11/14/2022 12:06 PM EDT ----- CT calcium score with elevated numbers:  Score 370 Would recommend more aggressive lipid management, Lipitor up to 40 mg daily and add zetia 10 mg daily Goal LDL <55

## 2022-11-14 NOTE — Telephone Encounter (Signed)
Patient spoke with nurse earlier, but he is asking that the nurse gives him call again. Please advise

## 2022-11-14 NOTE — Telephone Encounter (Signed)
Called and spoke with patient. Informed patient of the following from Dr. Mariah Milling.   CT calcium score with elevated numbers: Score 370 Would recommend more aggressive lipid management, Lipitor up to 40 mg daily and add zetia 10 mg daily Goal LDL <55  Patient verbalizes understanding. Prescriptions sent to preferred pharmacy.

## 2022-11-14 NOTE — Telephone Encounter (Signed)
Called and spoke with patient. Patient called back to let Dr. Mariah Milling that he is already taking Zetia 10 mg and to see if he needs to change that medication. Will forward to Dr. Mariah Milling.

## 2022-11-14 NOTE — Telephone Encounter (Signed)
Patient calling in regarding his results. Please advise

## 2022-11-17 NOTE — Progress Notes (Unsigned)
Cardiology Office Note  Date:  11/18/2022   ID:  JAMIAS DAMM, DOB 10-19-1960, MRN [id number]  PCP:  Enid Baas, MD   Chief Complaint  Patient presents with   Follow-up    Patient reports that he is having SOB, low energy,  a weird feeling in his chest. Meds reviewed verbally with patient.     HPI:  Achillies Allegra is a 62 year old male with past medical history of Hyperprolactinemia, work-up for gynecomastia Obesity Coronary calcium score : 184 in 2022, up to 370 in 8/24 Presenting for follow-up of his coronary calcification hyperlipidemia, cardiac risk factors  In follow-up today reports having some central mediastinal chest pain symptoms Concerned about finding of coronary calcification Also some shortness of breath on exertion  Testing below reviewed in detail Coronary calcium score August 2024 Coronary calcium score of 370. This was 87th percentile for age and sex matched control. CAC >300 in LAD, LCx, RCA  On Lipitor 20 with Zetia, LDL above goal Lipitor recently increased up to 40 daily with Zetia  Non-smoker, no diabetes Active at baseline, works in [employer], grading, cement work  EKG personally reviewed by myself on todays visit EKG Interpretation Date/Time:  Monday November 18 2022 09:48:16 EDT Ventricular Rate:  62 PR Interval:  170 QRS Duration:  96 QT Interval:  432 QTC Calculation: 438 R Axis:   -29  Text Interpretation: Normal sinus rhythm Normal ECG When compared with ECG of 30-Oct-2002 21:14, No significant change was found Confirmed by Julien Nordmann [phone]) on 11/18/2022 10:05:39 AM    PMH:   has a past medical history of HTN (hypertension), Hyperlipemia, and Venous insufficiency.  PSH:    Past Surgical History:  Procedure Laterality Date   COLONOSCOPY WITH PROPOFOL N/A 06/15/2018   Procedure: COLONOSCOPY WITH PROPOFOL;  Surgeon: Scot Jun, MD;  Location: [facility] ENDOSCOPY;  Service: Endoscopy;  Laterality: N/A;   EXTRACORPOREAL SHOCK  WAVE LITHOTRIPSY Right 02/17/2020   Procedure: EXTRACORPOREAL SHOCK WAVE LITHOTRIPSY (ESWL);  Surgeon: Sondra Come, MD;  Location: [facility] ORS;  Service: Urology;  Laterality: Right;   NO PAST SURGERIES      Current Outpatient Medications  Medication Sig Dispense Refill   amLODipine (NORVASC) 10 MG tablet TAKE (1) TABLET BY MOUTH DAILY AT BEDTIME 90 tablet 3   atorvastatin (LIPITOR) 40 MG tablet Take 1 tablet (40 mg total) by mouth at bedtime. 90 tablet 3   cabergoline (DOSTINEX) 0.5 MG tablet Take 0.5 tablets by mouth once a week.     cyclobenzaprine (FLEXERIL) 10 MG tablet Take 1 tablet (10 mg total) by mouth 3 (three) times daily as needed for muscle spasms. 30 tablet 0   ezetimibe (ZETIA) 10 MG tablet Take 1 tablet (10 mg total) by mouth daily. 90 tablet 3   triamterene-hydrochlorothiazide (MAXZIDE-25) 37.5-25 MG tablet TAKE ONE (1) TABLET BY MOUTH ONCE DAILY 90 tablet 3   No current facility-administered medications for this visit.     Allergies:   Patient has no known allergies.   Social History:  The patient  reports that he has never smoked. His smokeless tobacco use includes chew. He reports current alcohol use of about 1.0 standard drink of alcohol per week. He reports that he does not use drugs.   Family History:   family history includes Heart disease (age of onset: 73) in his father; Hyperlipidemia in his mother; Stroke in his mother.    Review of Systems: Review of Systems  Constitutional: Negative.   HENT: Negative.  Respiratory: Negative.    Cardiovascular: Negative.   Gastrointestinal: Negative.   Musculoskeletal: Negative.   Neurological: Negative.   Psychiatric/Behavioral: Negative.    All other systems reviewed and are negative.    PHYSICAL EXAM: VS:  BP 126/76 (BP Location: Left Arm, Patient Position: Sitting, Cuff Size: Large)   Pulse 62   Ht 6\' 2"  (1.88 m)   Wt 280 lb (127 kg)   BMI 35.95 kg/m  , BMI Body mass index is 35.95  kg/m. Constitutional:  oriented to person, place, and time. No distress.  HENT:  Head: Grossly normal Eyes:  no discharge. No scleral icterus.  Neck: No JVD, no carotid bruits  Cardiovascular: Regular rate and rhythm, no murmurs appreciated Pulmonary/Chest: Clear to auscultation bilaterally, no wheezes or rails Abdominal: Soft.  no distension.  no tenderness.  Musculoskeletal: Normal range of motion Neurological:  normal muscle tone. Coordination normal. No atrophy Skin: Skin warm and dry Psychiatric: normal affect, pleasant  Recent Labs: No results found for requested labs within last 365 days.    Lipid Panel Lab Results  Component Value Date   CHOL 141 09/07/2021   HDL 42.00 09/07/2021   LDLCALC 85 09/07/2021   TRIG 71.0 09/07/2021      Wt Readings from Last 3 Encounters:  11/18/22 280 lb (127 kg)  09/07/21 273 lb 9.6 oz (124.1 kg)  03/09/21 287 lb 12.8 oz (130.5 kg)     ASSESSMENT AND PLAN:  Problem List Items Addressed This Visit       Cardiology Problems   Hyperlipidemia   Other Visit Diagnoses     Coronary artery calcification    -  Primary   Relevant Orders   EKG 12-Lead (Completed)       Hyperlipidemia Goal LDL 55 or less Recommend he continue Lipitor 40 with Zetia daily If numbers above goal could change Lipitor to Crestor 40 daily  Chest pain/angina Coronary calcification greater than 300 Non-smoker, no diabetes,  Having some chest pain on exertion, also some shortness of breath Given his concern for his symptoms, cardiac CTA ordered for further evaluation  Obesity We have encouraged continued exercise, careful diet management in an effort to lose weight.  Shortness of breath Ischemic workup as above, also recommended diet modification, weight loss, exercise program    Total encounter time more than 40 minutes  Greater than 50% was spent in counseling and coordination of care with the patient    Signed, Dossie Arbour, M.D.,  Ph.D. [facility] [place], [place] [phone]

## 2022-11-18 ENCOUNTER — Encounter: Payer: Self-pay | Admitting: Cardiovascular Disease

## 2022-11-18 ENCOUNTER — Ambulatory Visit: Payer: BC Managed Care – PPO | Attending: Cardiovascular Disease | Admitting: Cardiovascular Disease

## 2022-11-18 VITALS — BP 126/76 | HR 62 | Ht 74.0 in | Wt 280.0 lb

## 2022-11-18 DIAGNOSIS — E782 Mixed hyperlipidemia: Secondary | ICD-10-CM | POA: Diagnosis not present

## 2022-11-18 DIAGNOSIS — I2510 Atherosclerotic heart disease of native coronary artery without angina pectoris: Secondary | ICD-10-CM | POA: Diagnosis not present

## 2022-11-18 DIAGNOSIS — I2584 Coronary atherosclerosis due to calcified coronary lesion: Secondary | ICD-10-CM

## 2022-11-18 DIAGNOSIS — R072 Precordial pain: Secondary | ICD-10-CM

## 2022-11-18 DIAGNOSIS — Z79899 Other long term (current) drug therapy: Secondary | ICD-10-CM

## 2022-11-18 MED ORDER — METOPROLOL TARTRATE 50 MG PO TABS: 50.0000 mg | ORAL_TABLET | Freq: Once | ORAL | 0 refills | Status: DC

## 2022-11-18 NOTE — Patient Instructions (Addendum)
Medication Instructions:  Metoprolol Tartrate 50 mg once 2 hours prior to Cardiac CT.  If you need a refill on your cardiac medications before your next appointment, please call your pharmacy.   Lab work: Your provider would like for you to have following labs drawn today BMP.     Testing/Procedures: Cardiac CTA for angina, coronary calcium   Your cardiac CT will be scheduled at one of the below locations:    [facility] [address] [place], [place] [address] [phone]  OR   [facility] [address] [place], [place] [address] [phone]   If scheduled at [facility] or [facility], please arrive 15 mins early for check-in and test prep.  There is spacious parking and easy access to the radiology department from the [facility] entrance. Please enter here and check-in with the desk attendant.   Please follow these instructions carefully (unless otherwise directed):  An IV will be started and you will be given Nitroglycerin.   On the Night Before the Test: Be sure to Drink plenty of water. Do not consume any caffeinated/decaffeinated beverages or chocolate 12 hours prior to your test. Do not take any antihistamines 12 hours prior to your test.  On the Day of the Test: Drink plenty of water until 1 hour prior to the test. Do not eat any food 1 hour prior to test. You may take your regular medications prior to the test.  Take metoprolol tartrate (Lopressor) 50 mg two hours prior to test. If you take Furosemide/Hydrochlorothiazide( Maxzide)/Spironolactone, please HOLD on the morning of the test.      After the Test: Drink plenty of water. After receiving IV contrast, you may experience a mild flushed feeling. This is normal. On occasion, you may experience a mild rash up to 24 hours after the test. This is not dangerous. If this  occurs, you can take Benadryl 25 mg and increase your fluid intake. If you experience trouble breathing, this can be serious. If it is severe call 911 IMMEDIATELY. If it is mild, please call our office. If you take any of these medications: Glipizide/Metformin, Avandament, Glucavance, please do not take 48 hours after completing test unless otherwise instructed.  We will call to schedule your test 2-4 weeks out understanding that some insurance companies will need an authorization prior to the service being performed.   For more information and frequently asked questions, please visit our website : [email or web address]  For non-scheduling related questions, please contact the cardiac imaging nurse navigator should you have any questions/concerns: Cardiac Imaging Nurse Navigators Direct Office Dial: [phone]   For scheduling needs, including cancellations and rescheduling, please call [place], [phone].   Follow-Up: At [facility], you and your health needs are our priority.  As part of our continuing mission to provide you with exceptional heart care, we have created designated Provider Care Teams.  These Care Teams include your primary Cardiologist (physician) and Advanced Practice Providers (APPs -  Physician Assistants and Nurse Practitioners) who all work together to provide you with the care you need, when you need it.  You will need a follow up appointment in 12 months  Providers on your designated Care Team:   Nicolasa Ducking, NP Eula Listen, PA-C Cadence Fransico [place], [place]  COVID-19 Vaccine Information can be found at: [email or web address] For questions related to vaccine distribution or appointments, please email [email or web address] or call [phone].

## 2022-11-18 NOTE — Telephone Encounter (Signed)
Patient seen in clinic today 11/18/22 by Dr. Mariah Milling.

## 2022-11-19 ENCOUNTER — Ambulatory Visit: Payer: BC Managed Care – PPO | Admitting: Cardiovascular Disease

## 2022-11-21 ENCOUNTER — Ambulatory Visit
Admission: RE | Admit: 2022-11-21 | Discharge: 2022-11-21 | Disposition: A | Discharge status: Home / Self Care | Payer: BC Managed Care – PPO | Source: Ambulatory Visit | Attending: Cardiovascular Disease | Admitting: Cardiovascular Disease

## 2022-11-21 DIAGNOSIS — R072 Precordial pain: Secondary | ICD-10-CM

## 2022-11-21 MED ORDER — SODIUM CHLORIDE 0.9 % IV SOLN: INTRAVENOUS | Status: DC

## 2022-11-21 MED ORDER — IOHEXOL 350 MG/ML SOLN
75.0000 mL | Freq: Once | INTRAVENOUS | Status: AC | PRN
  Administered 2022-11-21: 75 mL via INTRAVENOUS

## 2022-11-21 MED ORDER — NITROGLYCERIN 0.4 MG SL SUBL: 0.4000 mg | SUBLINGUAL_TABLET | Freq: Once | SUBLINGUAL | Status: DC

## 2022-11-21 MED ORDER — NITROGLYCERIN 0.4 MG SL SUBL
0.8000 mg | SUBLINGUAL_TABLET | Freq: Once | SUBLINGUAL | Status: AC
  Administered 2022-11-21: 0.8 mg via SUBLINGUAL

## 2022-11-21 NOTE — Progress Notes (Signed)

## 2022-11-28 ENCOUNTER — Ambulatory Visit: Admission: RE | Admit: 2022-11-28 | Payer: BC Managed Care – PPO | Source: Ambulatory Visit

## 2022-12-12 NOTE — Progress Notes (Signed)
Cardiology Office Note    Date:  12/13/2022   ID:  Joseph Dalton, DOB 08/23/1960, MRN [id number]  PCP:  Enid Baas, MD  Cardiologist:  Julien Nordmann, MD  Electrophysiologist:  None   Chief Complaint: Follow-up  History of Present Illness:   Joseph Dalton is a 62 y.o. male with history of CAD, aortic atherosclerosis, HTN, HLD, and hyperprolactinemia who presents for follow-up of coronary CTA.  He was evaluated in 2022 for cardiac risk stratification at the request of his PCP.  He was a non-smoker and did not have history of diabetes.  Calcium score in 2022 of 184 which was the 82nd percentile with coronary calcification involving the LAD, LCx, and RCA.  He underwent repeat calcium score on 11/12/2022 for further cardiac risk stratification which showed a score of 370, which was the 87th percentile.    He was seen in the office on 11/18/2022 reporting precordial pain.  In the setting of symptoms and calcium score as outlined above, he underwent coronary CTA on 11/21/2022 showed a calcium score of 370 which was the 87th percentile.  There was 25% stenosis in the proximal LAD and D1 with less than 25% stenosis in the proximal LCx and proximal RCA.  Aggressive risk factor modification was recommended.  He comes in accompanied by his wife today and is doing well from a cardiac perspective.  He is without symptoms of active angina or cardiac decompensation.  He does note several seconds lasting chest discomfort if he has been exerting himself, otherwise is without symptoms of chest pain.  He does note some exertional dyspnea.  No lower extremity swelling, abdominal distention, early satiety, PND, or progressive orthopnea.  Remains very active at baseline.  Tolerating recently titrated atorvastatin.  No dizziness, presyncope, or syncope.   Labs independently reviewed: 11/2022 - BUN 17, serum creatinine 0.99, potassium 3.5 04/2022 - A1c 5.4, Hgb 16.2, PLT 201, albumin 4.2, AST/ALT normal, TC  148, TG 81, HDL 45, LDL 87, TSH normal  Past Medical History:  Diagnosis Date   HTN (hypertension)    Hyperlipemia    Venous insufficiency     Past Surgical History:  Procedure Laterality Date   COLONOSCOPY WITH PROPOFOL N/A 06/15/2018   Procedure: COLONOSCOPY WITH PROPOFOL;  Surgeon: Scot Jun, MD;  Location: [facility] ENDOSCOPY;  Service: Endoscopy;  Laterality: N/A;   EXTRACORPOREAL SHOCK WAVE LITHOTRIPSY Right 02/17/2020   Procedure: EXTRACORPOREAL SHOCK WAVE LITHOTRIPSY (ESWL);  Surgeon: Sondra Come, MD;  Location: [facility] ORS;  Service: Urology;  Laterality: Right;   NO PAST SURGERIES      Current Medications: Current Meds  Medication Sig   amLODipine (NORVASC) 10 MG tablet TAKE (1) TABLET BY MOUTH DAILY AT BEDTIME   atorvastatin (LIPITOR) 40 MG tablet Take 1 tablet (40 mg total) by mouth at bedtime.   cyclobenzaprine (FLEXERIL) 10 MG tablet Take 1 tablet (10 mg total) by mouth 3 (three) times daily as needed for muscle spasms. (Patient taking differently: Take 10 mg by mouth 3 (three) times daily as needed for muscle spasms. PRN)   ezetimibe (ZETIA) 10 MG tablet Take 1 tablet (10 mg total) by mouth daily.   triamterene-hydrochlorothiazide (MAXZIDE-25) 37.5-25 MG tablet TAKE ONE (1) TABLET BY MOUTH ONCE DAILY   Current Facility-Administered Medications for the 12/13/22 encounter (Office Visit) with Sondra Barges, PA-C  Medication   aspirin chewable tablet 81 mg    Allergies:   Patient has no known allergies.   Social History  Socioeconomic History   Marital status: Married    Spouse name: Not on file   Number of children: 1   Years of education: Not on file   Highest education level: Not on file  Occupational History   Occupation: Self Employed - Nutritional therapist  Tobacco Use   Smoking status: Never   Smokeless tobacco: Current    Types: Chew  Vaping Use   Vaping status: Never Used  Substance and Sexual Activity   Alcohol use: Yes    Alcohol/week: 1.0  standard drink of alcohol    Types: 1 Cans of beer per week    Comment: Occasional   Drug use: No   Sexual activity: Not on file  Other Topics Concern   Not on file  Social History Narrative   Lives in [place] with wife, 1 daughter. Dog.  Works as Data processing manager.       Regular Exercise -  NO   Daily Caffeine Use:  1 cup coffee and 1 soda a day   Social Determinants of Health   Financial Resource Strain: Not on file  Food Insecurity: Not on file  Transportation Needs: Not on file  Physical Activity: Not on file  Stress: Not on file  Social Connections: Not on file     Family History:  The patient's family history includes Heart disease (age of onset: 41) in his father; Hyperlipidemia in his mother; Stroke in his mother. There is no history of Breast cancer.  ROS:   12-point review of systems is negative unless otherwise noted in the HPI.   EKGs/Labs/Other Studies Reviewed:    Studies reviewed were summarized above. The additional studies were reviewed today:  Coronary CTA 11/21/2022: FINDINGS: Aorta: Normal size. Minimal aortic wall calcifications. No dissection.   Aortic Valve:  Trileaflet.  No calcifications.   Coronary Arteries:  Normal coronary origin.  Right dominance.   RCA is a dominant artery. There is calcified plaque in the proximal RCA causing minimal stenosis (<25%).   Left main gives rise to LAD and LCX arteries. LM has no disease.   LAD has calcified plaque causing mild stenosis (25%) in the proximal LAD and first diagonal (D1).   LCX is a non-dominant artery. There is calcified plaque in the proximal LCx causing minimal stenosis (<25%).   Other findings:   Normal pulmonary vein drainage into the left atrium.   Normal left atrial appendage without a thrombus.   Normal size of the pulmonary artery.   IMPRESSION: 1. Coronary calcium score previously calculated. Value of 370. This was 87th percentile for age and sex matched control. 2.  Normal coronary origin with right dominance. 3. Mild stenosis in LAD and first diagonal (25%). 4. Minimal stenosis in RCA and LCx (<25%). 5. CAD-RADS 2. Mild non-obstructive CAD (25-49%). Consider non-atherosclerotic causes of chest pain. Consider preventive therapy and risk factor modification. __________  Calcium score 11/12/2022: Ascending Aorta: Normal size   Pericardium: Normal   Coronary arteries: Normal origin of left and right coronary arteries. Distribution of arterial calcifications if present, as noted below;   LM 0   LAD 331   LCx 24.8   RCA 14.5   Total 370   IMPRESSION AND RECOMMENDATION: 1. Coronary calcium score of 370. This was 87th percentile for age and sex matched control. 2. CAC >300 in LAD, LCx, RCA. CAC-DRS A3/N3. 3. Recommend aspirin and statin if no contraindication. 4. Consider cardiology consultation. 5. Continue heart healthy lifestyle and risk factor modification. __________  Calcium  score 01/01/2021: Ascending Aorta: Normal size   Pericardium: Normal   Coronary arteries: Normal origin of left and right coronary arteries. Distribution of arterial calcifications if present, as noted below;   LM 0   LAD 172   LCx 6.48   RCA 5.38   Total 184   IMPRESSION AND RECOMMENDATION: 1. Coronary calcium score of 184. This was 82nd percentile for age and sex matched control. 2. CAC 100-299 in LAD, LCx, RCA. CAC-DRS A2/N3. 3. Recommend aspirin and statin if no contraindications. 4. Continue heart healthy lifestyle and risk factor modification.   EKG:  EKG is not ordered today.    Recent Labs: 11/18/2022: BUN 17; Creatinine, Ser 0.99; Potassium 3.5; Sodium 142  Recent Lipid Panel    Component Value Date/Time   CHOL 141 09/07/2021 0847   TRIG 71.0 09/07/2021 0847   HDL 42.00 09/07/2021 0847   CHOLHDL 3 09/07/2021 0847   VLDL 14.2 09/07/2021 0847   LDLCALC 85 09/07/2021 0847   LDLDIRECT 89.0 09/07/2021 0847    PHYSICAL EXAM:     VS:  BP 132/84 (BP Location: Left Arm, Patient Position: Sitting, Cuff Size: Normal)   Pulse 100   Ht 6\' 2"  (1.88 m)   Wt 279 lb 9.6 oz (126.8 kg)   SpO2 95%   BMI 35.90 kg/m   BMI: Body mass index is 35.9 kg/m.  Physical Exam Vitals reviewed.  Constitutional:      Appearance: He is well-developed.  HENT:     Head: Normocephalic and atraumatic.  Eyes:     General:        Right eye: No discharge.        Left eye: No discharge.  Cardiovascular:     Rate and Rhythm: Normal rate and regular rhythm.     Heart sounds: Normal heart sounds, S1 normal and S2 normal. Heart sounds not distant. No midsystolic click and no opening snap. No murmur heard.    No friction rub.  Pulmonary:     Effort: Pulmonary effort is normal. No respiratory distress.     Breath sounds: Normal breath sounds. No decreased breath sounds, wheezing or rales.  Chest:     Chest wall: No tenderness.  Abdominal:     General: There is no distension.  Musculoskeletal:     Cervical back: Normal range of motion.     Right lower leg: No edema.     Left lower leg: No edema.  Skin:    General: Skin is warm and dry.     Nails: There is no clubbing.  Neurological:     Mental Status: He is alert and oriented to person, place, and time.  Psychiatric:        Speech: Speech normal.        Behavior: Behavior normal.        Thought Content: Thought content normal.        Judgment: Judgment normal.     Wt Readings from Last 3 Encounters:  12/13/22 279 lb 9.6 oz (126.8 kg)  11/18/22 280 lb (127 kg)  09/07/21 273 lb 9.6 oz (124.1 kg)     ASSESSMENT & PLAN:   Nonobstructive CAD: He is doing well and without symptoms of active angina or cardiac decompensation.  Coronary CTA showed mild nonobstructive disease as outlined above.  Continue aggressive risk factor modification and primary prevention including aspirin 81 mg, atorvastatin 40 mg, and ezetimibe 10 mg.  No indication for further ischemic testing at this  time.  Exertional  dyspnea: Coronary CTA with nonobstructive disease as outlined above.  Obtain echo to evaluate for significant structural abnormality.  HTN: Blood pressure is reasonably controlled in the office.  He remains on amlodipine 10 mg and triamterene/HCTZ 37.5/25 mg.  Low-sodium diet encouraged.  HLD: LDL 87 in 04/2022 with normal AST/ALT at that time.  Target LDL less than 70.  Now on atorvastatin 40 mg with ezetimibe 10 mg.  Look to repeat fasting lipid panel and LFT in several months time.  Heart healthy diet recommended.    Disposition: F/u with Dr. Mariah Milling or an APP in 2 months.   Medication Adjustments/Labs and Tests Ordered: Current medicines are reviewed at length with the patient today.  Concerns regarding medicines are outlined above. Medication changes, Labs and Tests ordered today are summarized above and listed in the Patient Instructions accessible in Encounters.   Signed, Eula Listen, PA-C 12/13/2022 4:30 PM     [facility] [address] [place], [place] [address] [phone]

## 2022-12-13 ENCOUNTER — Ambulatory Visit: Payer: BC Managed Care – PPO | Attending: Physician Assistant | Admitting: Physician Assistant

## 2022-12-13 ENCOUNTER — Encounter: Payer: Self-pay | Admitting: Physician Assistant

## 2022-12-13 VITALS — BP 132/84 | HR 100 | Ht 74.0 in | Wt 279.6 lb

## 2022-12-13 DIAGNOSIS — E785 Hyperlipidemia, unspecified: Secondary | ICD-10-CM | POA: Diagnosis not present

## 2022-12-13 DIAGNOSIS — R0609 Other forms of dyspnea: Secondary | ICD-10-CM | POA: Diagnosis not present

## 2022-12-13 DIAGNOSIS — I10 Essential (primary) hypertension: Secondary | ICD-10-CM

## 2022-12-13 DIAGNOSIS — I2510 Atherosclerotic heart disease of native coronary artery without angina pectoris: Secondary | ICD-10-CM

## 2022-12-13 MED ORDER — ASPIRIN 81 MG PO CHEW
81.0000 mg | CHEWABLE_TABLET | Freq: Once | ORAL | Status: AC
Start: 2022-12-13 — End: ?

## 2022-12-13 NOTE — Patient Instructions (Signed)
Medication Instructions:  Your physician recommends the following medication changes.  START TAKING: Aspirin 81 mg daily  *If you need a refill on your cardiac medications before your next appointment, please call your pharmacy*  Lab Work: None If you have labs (blood work) drawn today and your tests are completely normal, you will receive your results only by: MyChart Message (if you have MyChart) OR A paper copy in the mail If you have any lab test that is abnormal or we need to change your treatment, we will call you to review the results.  Testing/Procedures:  Your physician has requested that you have an echocardiogram. Echocardiography is a painless test that uses sound waves to create images of your heart. It provides your doctor with information about the size and shape of your heart and how well your heart's chambers and valves are working.   You may receive an ultrasound enhancing agent through an IV if needed to better visualize your heart during the echo. This procedure takes approximately one hour.  There are no restrictions for this procedure.  This will take place at [address] [facility] [phone], [place] [address]  Your physician has requested that you have an echocardiogram. Echocardiography is a painless test that uses sound waves to create images of your heart. It provides your doctor with information about the size and shape of your heart and how well your heart's chambers and valves are working. This procedure takes approximately one hour. There are no restrictions for this procedure. Please do NOT wear cologne, perfume, aftershave, or lotions (deodorant is allowed). Please arrive 15 minutes prior to your appointment time.   Follow-Up: At [facility], you and your health needs are our priority.  As part of our continuing mission to provide you with exceptional heart care, we have created designated Provider Care Teams.  These Care Teams  include your primary Cardiologist (physician) and Advanced Practice Providers (APPs -  Physician Assistants and Nurse Practitioners) who all work together to provide you with the care you need, when you need it.  We recommend signing up for the patient portal called "MyChart".  Sign up information is provided on this After Visit Summary.  MyChart is used to connect with patients for Virtual Visits (Telemedicine).  Patients are able to view lab/test results, encounter notes, upcoming appointments, etc.  Non-urgent messages can be sent to your provider as well.   To learn more about what you can do with MyChart, go to [email or web address].    Your next appointment:   4 to 6 month(s)  Provider:   You may see Julien Nordmann, MD or one of the following Advanced Practice Providers on your designated Care Team:   Eula Listen, [place]

## 2022-12-27 ENCOUNTER — Ambulatory Visit: Payer: BC Managed Care – PPO | Admitting: Cardiovascular Disease

## 2023-01-06 ENCOUNTER — Ambulatory Visit: Payer: BC Managed Care – PPO | Attending: Physician Assistant

## 2023-01-06 DIAGNOSIS — R0609 Other forms of dyspnea: Secondary | ICD-10-CM

## 2023-01-06 LAB — ECHOCARDIOGRAM COMPLETE
Area-P 1/2: 3.77 cm2
S' Lateral: 3.4 cm

## 2023-01-07 ENCOUNTER — Other Ambulatory Visit: Payer: Self-pay | Admitting: Emergency Medicine

## 2023-01-07 DIAGNOSIS — I77810 Thoracic aortic ectasia: Secondary | ICD-10-CM

## 2023-09-22 ENCOUNTER — Other Ambulatory Visit: Payer: Self-pay | Admitting: Cardiovascular Disease

## 2023-11-26 ENCOUNTER — Other Ambulatory Visit: Payer: Self-pay | Admitting: Emergency Medicine

## 2023-11-26 DIAGNOSIS — I77810 Thoracic aortic ectasia: Secondary | ICD-10-CM

## 2023-11-26 NOTE — Progress Notes (Signed)
 Scheduling team requested new Echo order - placed and team messaged

## 2023-11-28 ENCOUNTER — Other Ambulatory Visit: Payer: Self-pay | Admitting: Cardiovascular Disease

## 2023-12-01 ENCOUNTER — Ambulatory Visit: Payer: Self-pay

## 2023-12-01 DIAGNOSIS — K5730 Diverticulosis of large intestine without perforation or abscess without bleeding: Secondary | ICD-10-CM | POA: Diagnosis not present

## 2023-12-01 DIAGNOSIS — Z860101 Personal history of adenomatous and serrated colon polyps: Secondary | ICD-10-CM | POA: Diagnosis not present

## 2023-12-01 DIAGNOSIS — D123 Benign neoplasm of transverse colon: Secondary | ICD-10-CM | POA: Diagnosis not present

## 2023-12-01 DIAGNOSIS — Z09 Encounter for follow-up examination after completed treatment for conditions other than malignant neoplasm: Secondary | ICD-10-CM | POA: Diagnosis present

## 2023-12-29 ENCOUNTER — Other Ambulatory Visit: Payer: Self-pay | Admitting: Cardiovascular Disease

## 2023-12-29 NOTE — Telephone Encounter (Signed)
Please contact pt for future appointment. Pt overdue for follow up.

## 2024-01-01 NOTE — Progress Notes (Unsigned)
 Cardiology Office Note    Date:  01/02/2024   ID:  Joseph Dalton, DOB October 28, 1960, MRN [id number]  PCP:  Sherial Bail, MD  Cardiologist:  Evalene Lunger, MD  Electrophysiologist:  None   Chief Complaint: Follow up  History of Present Illness:   Joseph Dalton is a 63 y.o. male with history of nonobstructive CAD by coronary CTA, aortic atherosclerosis, dilated aortic root and ascending aorta, HTN, HLD, and hyperprolactinemia who presents for follow-up of CAD.  He was evaluated in 2022 for cardiac risk stratification at the request of his PCP.  He was a non-smoker and did not have history of diabetes.  Calcium  score in 2022 of 184 which was the 82nd percentile with coronary calcification involving the LAD, LCx, and RCA.  He underwent repeat calcium  score on 11/12/2022 for further cardiac risk stratification which showed a score of 370, which was the 87th percentile.     He was seen in the office on 11/18/2022 reporting precordial pain.  He underwent coronary CTA on 11/21/2022 that showed a calcium  score of 370 which was the 87th percentile.  There was 25% stenosis in the proximal LAD and D1 with less than 25% stenosis in the proximal LCx and proximal RCA.  Aggressive risk factor modification was recommended.  He was last seen in the office in 12/2022 and was doing well from a cardiac perspective.  He reported chest discomfort that would last for several seconds with exertion, otherwise was without symptoms of angina or cardiac decompensation.  Echo in 12/2022 showed an EF of 60 to 65%, no regional wall motion abnormalities, mild LVH, grade 1 diastolic dysfunction, normal RV systolic function and ventricular cavity size, no significant valvular abnormality with a tricuspid aortic valve noted, mild dilatation of the aortic root measuring 41 mm, and mild dilatation of the ascending aorta measuring 39 mm.  He comes in doing well from a cardiac perspective and is without symptoms of angina or cardiac  decompensation.  No dizziness, presyncope, or syncope.  No significant lower extremity swelling or progressive orthopnea.  He does continue to note a gradual increase in weight and attributes this to food choices and timing of meals at times.  No abdominal distention or early satiety.  No falls or symptoms concerning for bleeding.  Overall feels like he is doing well from a cardiac perspective at this time.   Labs independently reviewed: 12/2023 - Hgb 15.9, PLT 187, potassium 3.6, BUN 21, serum creatinine 1.0, albumin 4.1, AST/ALT normal, TC 158, TG 141, HDL 45, LDL 85, A1c 5.5, TSH normal  Past Medical History:  Diagnosis Date   HTN (hypertension)    Hyperlipemia    Venous insufficiency     Past Surgical History:  Procedure Laterality Date   COLONOSCOPY WITH PROPOFOL  N/A 06/15/2018   Procedure: COLONOSCOPY WITH PROPOFOL ;  Surgeon: Viktoria Lamar DASEN, MD;  Location: [facility] ENDOSCOPY;  Service: Endoscopy;  Laterality: N/A;   EXTRACORPOREAL SHOCK WAVE LITHOTRIPSY Right 02/17/2020   Procedure: EXTRACORPOREAL SHOCK WAVE LITHOTRIPSY (ESWL);  Surgeon: Francisca Redell BROCKS, MD;  Location: [facility] ORS;  Service: Urology;  Laterality: Right;   NO PAST SURGERIES      Current Medications: Current Meds  Medication Sig   amLODipine  (NORVASC ) 10 MG tablet TAKE (1) TABLET BY MOUTH DAILY AT BEDTIME   triamterene -hydrochlorothiazide  (MAXZIDE-25) 37.5-25 MG tablet TAKE ONE (1) TABLET BY MOUTH ONCE DAILY   [DISCONTINUED] atorvastatin  (LIPITOR) 40 MG tablet TAKE (1) TABLET BY MOUTH AT BEDTIME   [DISCONTINUED] ezetimibe  (  ZETIA ) 10 MG tablet TAKE (1) TABLET BY MOUTH EVERY DAY   Current Facility-Administered Medications for the 01/02/24 encounter (Office Visit) with Abigail Bernardino HERO, PA-C  Medication   aspirin  chewable tablet 81 mg    Allergies:   Patient has no known allergies.   Social History   Socioeconomic History   Marital status: Married    Spouse name: Not on file   Number of children: 1   Years of  education: Not on file   Highest education level: Not on file  Occupational History   Occupation: Self Employed - Nutritional therapist  Tobacco Use   Smoking status: Never   Smokeless tobacco: Current    Types: Chew  Vaping Use   Vaping status: Never Used  Substance and Sexual Activity   Alcohol use: Yes    Alcohol/week: 1.0 standard drink of alcohol    Types: 1 Cans of beer per week    Comment: Occasional   Drug use: No   Sexual activity: Not on file  Other Topics Concern   Not on file  Social History Narrative   Lives in [place] with wife, 1 daughter. Dog.  Works as Data processing manager.       Regular Exercise -  NO   Daily Caffeine Use:  1 cup coffee and 1 soda a day   Social Drivers of Corporate investment banker Strain: Low Risk  (12/17/2023)   Received from [facility]   Overall Financial Resource Strain (CARDIA)    Difficulty of Paying Living Expenses: Not hard at all  Food Insecurity: No Food Insecurity (12/17/2023)   Received from [facility]   Hunger Vital Sign    Within the past 12 months, you worried that your food would run out before you got the money to buy more.: Never true    Within the past 12 months, the food you bought just didn't last and you didn't have money to get more.: Never true  Transportation Needs: No Transportation Needs (12/17/2023)   Received from [facility] - Transportation    In the past 12 months, has lack of transportation kept you from medical appointments or from getting medications?: No    Lack of Transportation (Non-Medical): No  Physical Activity: Not on file  Stress: Not on file  Social Connections: Not on file     Family History:  The patient's family history includes Heart disease (age of onset: 40) in his father; Hyperlipidemia in his mother; Stroke in his mother. There is no history of Breast cancer.  ROS:   12-point review of systems is negative unless  otherwise noted in the HPI.   EKGs/Labs/Other Studies Reviewed:    Studies reviewed were summarized above. The additional studies were reviewed today:  2D echo 01/06/2023: 1. Left ventricular ejection fraction, by estimation, is 60 to 65%. The  left ventricle has normal function. The left ventricle has no regional  wall motion abnormalities. There is mild left ventricular hypertrophy.  Left ventricular diastolic parameters  are consistent with Grade I diastolic dysfunction (impaired relaxation).  The average left ventricular global longitudinal strain is -21.7 %. The  global longitudinal strain is normal.   2. Right ventricular systolic function is normal. The right ventricular  size is normal.   3. The mitral valve is normal in structure. No evidence of mitral valve  regurgitation.   4. The aortic valve is tricuspid. Aortic valve regurgitation is not  visualized.  5. Aortic dilatation noted. There is mild dilatation of the aortic root,  measuring 41 mm. There is mild dilatation of the ascending aorta,  measuring 39 mm.   6. The inferior vena cava is normal in size with greater than 50%  respiratory variability, suggesting right atrial pressure of 3 mmHg.  __________  Coronary CTA 11/21/2022: FINDINGS: Aorta: Normal size. Minimal aortic wall calcifications. No dissection.   Aortic Valve:  Trileaflet.  No calcifications.   Coronary Arteries:  Normal coronary origin.  Right dominance.   RCA is a dominant artery. There is calcified plaque in the proximal RCA causing minimal stenosis (<25%).   Left main gives rise to LAD and LCX arteries. LM has no disease.   LAD has calcified plaque causing mild stenosis (25%) in the proximal LAD and first diagonal (D1).   LCX is a non-dominant artery. There is calcified plaque in the proximal LCx causing minimal stenosis (<25%).   Other findings:   Normal pulmonary vein drainage into the left atrium.   Normal left atrial appendage  without a thrombus.   Normal size of the pulmonary artery.   IMPRESSION: 1. Coronary calcium  score previously calculated. Value of 370. This was 87th percentile for age and sex matched control. 2. Normal coronary origin with right dominance. 3. Mild stenosis in LAD and first diagonal (25%). 4. Minimal stenosis in RCA and LCx (<25%). 5. CAD-RADS 2. Mild non-obstructive CAD (25-49%). Consider non-atherosclerotic causes of chest pain. Consider preventive therapy and risk factor modification. __________   Calcium  score 11/12/2022: Ascending Aorta: Normal size   Pericardium: Normal   Coronary arteries: Normal origin of left and right coronary arteries. Distribution of arterial calcifications if present, as noted below;   LM 0   LAD 331   LCx 24.8   RCA 14.5   Total 370   IMPRESSION AND RECOMMENDATION: 1. Coronary calcium  score of 370. This was 87th percentile for age and sex matched control. 2. CAC >300 in LAD, LCx, RCA. CAC-DRS A3/N3. 3. Recommend aspirin  and statin if no contraindication. 4. Consider cardiology consultation. 5. Continue heart healthy lifestyle and risk factor modification. __________   Calcium  score 01/01/2021: Ascending Aorta: Normal size   Pericardium: Normal   Coronary arteries: Normal origin of left and right coronary arteries. Distribution of arterial calcifications if present, as noted below;   LM 0   LAD 172   LCx 6.48   RCA 5.38   Total 184   IMPRESSION AND RECOMMENDATION: 1. Coronary calcium  score of 184. This was 82nd percentile for age and sex matched control. 2. CAC 100-299 in LAD, LCx, RCA. CAC-DRS A2/N3. 3. Recommend aspirin  and statin if no contraindications. 4. Continue heart healthy lifestyle and risk factor modification.   EKG:  EKG is ordered today.  The EKG ordered today demonstrates NSR, 60 bpm, left axis deviation, poor R wave progression along the precordial leads, no acute ST-T changes  Recent Labs: No results  found for requested labs within last 365 days.  Recent Lipid Panel    Component Value Date/Time   CHOL 141 09/07/2021 0847   TRIG 71.0 09/07/2021 0847   HDL 42.00 09/07/2021 0847   CHOLHDL 3 09/07/2021 0847   VLDL 14.2 09/07/2021 0847   LDLCALC 85 09/07/2021 0847   LDLDIRECT 89.0 09/07/2021 0847    PHYSICAL EXAM:    VS:  BP 136/88 (BP Location: Left Arm, Patient Position: Sitting, Cuff Size: Normal)   Pulse 60   Wt 294 lb (133.4 kg)   SpO2  98%   BMI 37.75 kg/m   BMI: Body mass index is 37.75 kg/m.  Physical Exam Vitals reviewed.  Constitutional:      Appearance: He is well-developed.  HENT:     Head: Normocephalic and atraumatic.  Eyes:     General:        Right eye: No discharge.        Left eye: No discharge.  Cardiovascular:     Rate and Rhythm: Normal rate and regular rhythm.     Heart sounds: Normal heart sounds, S1 normal and S2 normal. Heart sounds not distant. No midsystolic click and no opening snap. No murmur heard.    No friction rub.  Pulmonary:     Effort: Pulmonary effort is normal. No respiratory distress.     Breath sounds: Normal breath sounds. No decreased breath sounds, wheezing, rhonchi or rales.  Chest:     Chest wall: No tenderness.  Musculoskeletal:     Cervical back: Normal range of motion.  Skin:    General: Skin is warm and dry.     Nails: There is no clubbing.  Neurological:     Mental Status: He is alert and oriented to person, place, and time.  Psychiatric:        Speech: Speech normal.        Behavior: Behavior normal.        Thought Content: Thought content normal.        Judgment: Judgment normal.     Wt Readings from Last 3 Encounters:  01/02/24 294 lb (133.4 kg)  12/13/22 279 lb 9.6 oz (126.8 kg)  11/18/22 280 lb (127 kg)     ASSESSMENT & PLAN:   Nonobstructive CAD: He is doing well and without symptoms concerning for angina or cardiac decompensation.  Coronary CTA last year showed mild nonobstructive disease as  outlined above.  Continue aggressive risk factor modification and primary prevention including aspirin  81 mg, atorvastatin  40 mg, and ezetimibe  10 mg.  No indication for further ischemic testing at this time.  Dilated aortic root/ascending aorta: Echo in 12/2022 showed an aortic root measuring 41 mm with ascending aorta measuring 39 mm.  Aortic valve documented to be tricuspid by echo at that time.  Aorta documented to be normal size by CTA in 11/2022.  Anticipate follow-up echo at next visit.    HTN: Blood pressure is reasonably controlled in the office this morning.  He is on amlodipine  10 mg daily and triamterene /HCTZ 37.5/25 mg.  HLD: LDL 85 in 12/2023 with normal AST/ALT at that time.  Remains on atorvastatin  40 mg and ezetimibe  10 mg.  Prefers to work on heart healthy diet at this time.    Disposition: F/u with Dr. Gollan or an APP in 12 months.   Medication Adjustments/Labs and Tests Ordered: Current medicines are reviewed at length with the patient today.  Concerns regarding medicines are outlined above. Medication changes, Labs and Tests ordered today are summarized above and listed in the Patient Instructions accessible in Encounters.   Signed, Bernardino Bring, PA-C 01/02/2024 9:44 AM     [facility] [place] [address] [place], [place] [address] [phone]

## 2024-01-02 ENCOUNTER — Ambulatory Visit: Attending: Physician Assistant | Admitting: Physician Assistant

## 2024-01-02 ENCOUNTER — Encounter: Payer: Self-pay | Admitting: Physician Assistant

## 2024-01-02 VITALS — BP 136/88 | HR 60 | Wt 294.0 lb

## 2024-01-02 DIAGNOSIS — I77810 Thoracic aortic ectasia: Secondary | ICD-10-CM | POA: Diagnosis not present

## 2024-01-02 DIAGNOSIS — I2510 Atherosclerotic heart disease of native coronary artery without angina pectoris: Secondary | ICD-10-CM

## 2024-01-02 DIAGNOSIS — I10 Essential (primary) hypertension: Secondary | ICD-10-CM

## 2024-01-02 DIAGNOSIS — E785 Hyperlipidemia, unspecified: Secondary | ICD-10-CM

## 2024-01-02 MED ORDER — EZETIMIBE 10 MG PO TABS: 10.0000 mg | ORAL_TABLET | Freq: Every day | ORAL | 3 refills | Status: AC

## 2024-01-02 MED ORDER — ATORVASTATIN CALCIUM 40 MG PO TABS: 40.0000 mg | ORAL_TABLET | Freq: Every day | ORAL | 3 refills | Status: AC

## 2024-01-02 NOTE — Patient Instructions (Signed)
 Medication Instructions:  Your physician recommends that you continue on your current medications as directed. Please refer to the Current Medication list given to you today.   *If you need a refill on your cardiac medications before your next appointment, please call your pharmacy*  Lab Work: No labs ordered today  If you have labs (blood work) drawn today and your tests are completely normal, you will receive your results only by: MyChart Message (if you have MyChart) OR A paper copy in the mail If you have any lab test that is abnormal or we need to change your treatment, we will call you to review the results.  Testing/Procedures: No test ordered today   Follow-Up: At [facility], you and your health needs are our priority.  As part of our continuing mission to provide you with exceptional heart care, our providers are all part of one team.  This team includes your primary Cardiologist (physician) and Advanced Practice Providers or APPs (Physician Assistants and Nurse Practitioners) who all work together to provide you with the care you need, when you need it.  Your next appointment:   12 month(s)  Provider:   You may see Timothy Gollan, MD or one of the following Advanced Practice Providers on your designated Care Team:   Lonni Meager, NP Lesley Maffucci, PA-C Bernardino Bring, PA-C Cadence Lisbon, PA-C Tylene Lunch, NP Barnie Hila, NP

## 2024-01-20 ENCOUNTER — Ambulatory Visit: Payer: Self-pay | Attending: Cardiology

## 2024-01-20 ENCOUNTER — Ambulatory Visit: Payer: Self-pay | Admitting: Physician Assistant

## 2024-01-20 DIAGNOSIS — I77810 Thoracic aortic ectasia: Secondary | ICD-10-CM | POA: Diagnosis not present

## 2024-01-20 LAB — ECHOCARDIOGRAM COMPLETE
AR max vel: 3.82 cm2
AV Area VTI: 4.21 cm2
AV Area mean vel: 3.56 cm2
AV Mean grad: 3 mmHg
AV Peak grad: 4.8 mmHg
Ao pk vel: 1.09 m/s
Area-P 1/2: 3.17 cm2
S' Lateral: 3.47 cm

## 2024-04-29 ENCOUNTER — Ambulatory Visit

## 2024-05-19 ENCOUNTER — Ambulatory Visit
